# Patient Record
Sex: Male | Born: 1995 | Race: White | Hispanic: No | Marital: Married | Smoking: Never smoker
Health system: Southern US, Community
[De-identification: ages and names within clinical notes are randomized; demographics above are authoritative.]

## PROBLEM LIST (undated history)

## (undated) DIAGNOSIS — R740 Nonspecific elevation of levels of transaminase and lactic acid dehydrogenase [LDH]: Secondary | ICD-10-CM

## (undated) DIAGNOSIS — T7840XA Allergy, unspecified, initial encounter: Secondary | ICD-10-CM

## (undated) DIAGNOSIS — F32A Depression, unspecified: Secondary | ICD-10-CM

## (undated) DIAGNOSIS — E782 Mixed hyperlipidemia: Secondary | ICD-10-CM

## (undated) HISTORY — PX: MOUTH SURGERY: SHX715

## (undated) HISTORY — DX: Depression, unspecified: F32.A

## (undated) HISTORY — DX: Allergy, unspecified, initial encounter: T78.40XA

## (undated) HISTORY — DX: Nonspecific elevation of levels of transaminase and lactic acid dehydrogenase (ldh): R74.0

## (undated) HISTORY — DX: Mixed hyperlipidemia: E78.2

---

## 2015-01-20 DIAGNOSIS — J302 Other seasonal allergic rhinitis: Secondary | ICD-10-CM | POA: Insufficient documentation

## 2015-11-02 DIAGNOSIS — Z113 Encounter for screening for infections with a predominantly sexual mode of transmission: Secondary | ICD-10-CM | POA: Diagnosis not present

## 2015-11-12 DIAGNOSIS — R05 Cough: Secondary | ICD-10-CM | POA: Diagnosis not present

## 2015-11-12 DIAGNOSIS — J019 Acute sinusitis, unspecified: Secondary | ICD-10-CM | POA: Diagnosis not present

## 2016-01-02 DIAGNOSIS — J3089 Other allergic rhinitis: Secondary | ICD-10-CM | POA: Diagnosis not present

## 2016-10-05 DIAGNOSIS — B349 Viral infection, unspecified: Secondary | ICD-10-CM | POA: Diagnosis not present

## 2016-12-01 ENCOUNTER — Emergency Department (HOSPITAL_COMMUNITY)
Admission: EM | Admit: 2016-12-01 | Discharge: 2016-12-01 | Disposition: A | Discharge status: Home / Self Care | Payer: BLUE CROSS/BLUE SHIELD | Attending: Emergency Medicine | Admitting: Emergency Medicine

## 2016-12-01 ENCOUNTER — Encounter (HOSPITAL_COMMUNITY): Payer: Self-pay | Admitting: Emergency Medicine

## 2016-12-01 DIAGNOSIS — T17208A Unspecified foreign body in pharynx causing other injury, initial encounter: Secondary | ICD-10-CM | POA: Diagnosis not present

## 2016-12-01 DIAGNOSIS — Y939 Activity, unspecified: Secondary | ICD-10-CM | POA: Diagnosis not present

## 2016-12-01 DIAGNOSIS — X58XXXA Exposure to other specified factors, initial encounter: Secondary | ICD-10-CM | POA: Insufficient documentation

## 2016-12-01 DIAGNOSIS — Y999 Unspecified external cause status: Secondary | ICD-10-CM | POA: Insufficient documentation

## 2016-12-01 DIAGNOSIS — Y929 Unspecified place or not applicable: Secondary | ICD-10-CM | POA: Diagnosis not present

## 2016-12-01 NOTE — ED Notes (Signed)
PT DISCHARGED. INSTRUCTIONS GIVEN. AAOX4. PT IN NO APPARENT DISTRESS OR PAIN. THE OPPORTUNITY TO ASK QUESTIONS WAS PROVIDED. 

## 2016-12-01 NOTE — Discharge Instructions (Signed)
Return to the ED with any concerns including difficulty breathing or swallowing, or any other alarming symptoms °

## 2016-12-01 NOTE — ED Triage Notes (Signed)
Pt verbalizes felt something in throat "pulled it out and put it in this cup." Pt believes he may have a parasite.

## 2016-12-01 NOTE — ED Provider Notes (Signed)
[facility]-EMERGENCY DEPT Provider Note   CSN: [id number] Arrival date & time: 12/01/16  1122     History   Chief Complaint Chief Complaint  Patient presents with  . Foreign Body in Throat    HPI Jonathan Kaufman is a 21 y.o. male.  HPI  Pt presenting with c/o feeling a foreign body in his throat.  He had been walking outside and then noted he felt something sticking out of his tonsil into his mouth.  He pullled on it and pulled a long thin foreign body out of his mouth.  He is concerned this may be parasite.  No bleeding or pain from his tonsil.  No difficulty breathing or swallowing afterwards.  He has not had similar symptoms in the past.  There are no other associated systemic symptoms, there are no other alleviating or modifying factors.   History reviewed. No pertinent past medical history.  There are no active problems to display for this patient.   History reviewed. No pertinent surgical history.     Home Medications    Prior to Admission medications   Not on File    Family History No family history on file.  Social History Social History  Substance Use Topics  . Smoking status: Not on file  . Smokeless tobacco: Not on file  . Alcohol use Not on file     Allergies   Patient has no known allergies.   Review of Systems Review of Systems  ROS reviewed and all otherwise negative except for mentioned in HPI   Physical Exam Updated Vital Signs BP (!) 153/85 (BP Location: Right Arm)   Pulse 95   Temp 98.4 F (36.9 C) (Oral)   Resp 18   Ht 6\' 4"  (1.93 m)   Wt 260 lb (117.9 kg)   SpO2 100%   BMI 31.65 kg/m  Vitals reviewed Physical Exam Physical Examination: General appearance - alert, well appearing, and in no distress Mental status - alert, oriented to person, place, and time Eyes -  No conjunctival injection, no scleral icterus Mouth - mucous membranes moist, pharynx normal without lesions Neck - supple, no significant adenopathy Chest - clear to  auscultation, no wheezes, rales or rhonchi, symmetric air entry Heart - normal rate, regular rhythm, normal S1, S2, no murmurs, rubs, clicks or gallops Neurological - alert, oriented x 3, moving all extemiteis, normal speech Extremities - peripheral pulses normal, no pedal edema, no clubbing or cyanosis Skin - normal coloration and turgor, no rashes  ED Treatments / Results  Labs (all labs ordered are listed, but only abnormal results are displayed) Labs Reviewed - No data to display  EKG  EKG Interpretation None       Radiology No results found.  Procedures Procedures (including critical care time)  Medications Ordered in ED Medications - No data to display   Initial Impression / Assessment and Plan / ED Course  I have reviewed the triage vital signs and the nursing notes.  Pertinent labs & imaging results that were available during my care of the patient were reviewed by me and considered in my medical decision making (see chart for details).     Pt presenting with a foreign body that he pulled out of his throat.  - this appears under magnification to be a piece of plant matter- no parasite. He has normal OP exam, normal tonsil.  No difficulty swallowing or breathing.  Reassurance provided.  Discharged with strict return precautions.  Pt agreeable with plan.  Final Clinical Impressions(s) / ED Diagnoses   Final diagnoses:  Foreign body in pharynx, initial encounter    New Prescriptions There are no discharge medications for this patient.    Jerelyn Scott, MD 12/02/16 [phone]

## 2017-03-06 ENCOUNTER — Ambulatory Visit (INDEPENDENT_AMBULATORY_CARE_PROVIDER_SITE_OTHER): Payer: BLUE CROSS/BLUE SHIELD | Admitting: Family Medicine

## 2017-03-06 ENCOUNTER — Encounter: Payer: Self-pay | Admitting: Family Medicine

## 2017-03-06 VITALS — BP 120/70 | HR 76 | Ht 75.5 in | Wt 281.8 lb

## 2017-03-06 DIAGNOSIS — Z79899 Other long term (current) drug therapy: Secondary | ICD-10-CM | POA: Diagnosis not present

## 2017-03-06 DIAGNOSIS — Z7251 High risk heterosexual behavior: Secondary | ICD-10-CM | POA: Diagnosis not present

## 2017-03-06 DIAGNOSIS — Z7689 Persons encountering health services in other specified circumstances: Secondary | ICD-10-CM | POA: Diagnosis not present

## 2017-03-06 DIAGNOSIS — Z23 Encounter for immunization: Secondary | ICD-10-CM | POA: Diagnosis not present

## 2017-03-06 DIAGNOSIS — R635 Abnormal weight gain: Secondary | ICD-10-CM | POA: Diagnosis not present

## 2017-03-06 LAB — CBC WITH DIFFERENTIAL/PLATELET
BASOS PCT: 1 %
Basophils Absolute: 58 cells/uL (ref 0–200)
EOS PCT: 12 %
Eosinophils Absolute: 696 cells/uL — ABNORMAL HIGH (ref 15–500)
HCT: 48.8 % (ref 38.5–50.0)
HEMOGLOBIN: 16.7 g/dL (ref 13.2–17.1)
LYMPHS ABS: 2146 {cells}/uL (ref 850–3900)
Lymphocytes Relative: 37 %
MCH: 29.8 pg (ref 27.0–33.0)
MCHC: 34.2 g/dL (ref 32.0–36.0)
MCV: 87 fL (ref 80.0–100.0)
MONO ABS: 464 {cells}/uL (ref 200–950)
MPV: 10 fL (ref 7.5–12.5)
Monocytes Relative: 8 %
NEUTROS ABS: 2436 {cells}/uL (ref 1500–7800)
NEUTROS PCT: 42 %
Platelets: 270 10*3/uL (ref 140–400)
RBC: 5.61 MIL/uL (ref 4.20–5.80)
RDW: 13.2 % (ref 11.0–15.0)
WBC: 5.8 10*3/uL (ref 4.0–10.5)

## 2017-03-06 LAB — TSH: TSH: 1.76 mIU/L (ref 0.40–4.50)

## 2017-03-06 NOTE — Patient Instructions (Addendum)
Use the Lose It app as discussed. Start getting at least 150 minutes of exercise each week.  We will call you with lab results.   You will need to return in 1-2 months for your 2nd Gardasil vaccine and in 6 months (February) for your 3rd and final dose.

## 2017-03-06 NOTE — Progress Notes (Signed)
   Subjective:    Patient ID: Jonathan Kaufman, male    DOB: [birth year], 21 y.o.   MRN: [id number]  HPI Chief Complaint  Patient presents with  . new pt    new pt get established. no other concerns    He is new to the practice and here to establish care.   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 Works at [employer].   States he has been healthy. States he is concerned about weight gain over the past year. States he has gained approximately 40 lbs. Reports having issues with weight and has lost 80 lbs in the past by counting calories and eating healthy.     He would also like to discuss starting on PrEP. He has male sexual partners. Denies history of STDs.   He is not sure if he had the initial HPV vaccine in the past but he is certain that he did not have more than one. Would like to complete the series.   Denies fever, chills, dizziness, chest pain, palpitations, abdominal pain, N/V/D, urinary symptoms.   Reviewed allergies, medications, past medical, surgical, family, and social history.   Review of Systems Pertinent positives and negatives in the history of present illness.     Objective:   Physical Exam BP 120/70   Pulse 76   Ht 6' 3.5" (1.918 m)   Wt 281 lb 12.8 oz (127.8 kg)   BMI 34.76 kg/m  Alert and in no distress. Pharyngeal area is normal. Neck is supple without adenopathy or thyromegaly. Cardiac exam shows a regular sinus rhythm without murmurs or gallops. Lungs are clear to auscultation.     Assessment & Plan:  Weight gain - Plan: CBC with Differential/Platelet, Comprehensive metabolic panel, TSH  Encounter to establish care  High risk sexual behavior - Plan: RPR, GC/Chlamydia Probe Amp, HIV antibody, Hepatitis B surface antibody, Hepatitis B surface antigen  Medication management - Plan: Hepatitis B surface antibody, Hepatitis B surface antigen  Need for HPV vaccination  Counseled on healthy diet and exercise for weight loss. He will use a free app to track  calories and nutrition. Will check labs. Discussed that Truvada for PrEP is supposed to be in addition to condoms and safe sex and not in lieu of these.  Pending labs results we will start him on Truvada. Handouts given regarding PrEP.  He is aware that he will need the 3 injection series of Gardasil. 1st dose given.  He will need the 2nd dose in 1-2 months and 3rd dose in 6 months.

## 2017-03-07 ENCOUNTER — Other Ambulatory Visit: Payer: Self-pay | Admitting: Family Medicine

## 2017-03-07 ENCOUNTER — Encounter: Payer: Self-pay | Admitting: Family Medicine

## 2017-03-07 DIAGNOSIS — Z789 Other specified health status: Secondary | ICD-10-CM | POA: Insufficient documentation

## 2017-03-07 DIAGNOSIS — Z7251 High risk heterosexual behavior: Secondary | ICD-10-CM | POA: Insufficient documentation

## 2017-03-07 DIAGNOSIS — Z79899 Other long term (current) drug therapy: Secondary | ICD-10-CM

## 2017-03-07 LAB — COMPREHENSIVE METABOLIC PANEL
ALBUMIN: 4.7 g/dL (ref 3.6–5.1)
ALT: 28 U/L (ref 9–46)
AST: 21 U/L (ref 10–40)
Alkaline Phosphatase: 63 U/L (ref 40–115)
BUN: 9 mg/dL (ref 7–25)
CALCIUM: 9.6 mg/dL (ref 8.6–10.3)
CHLORIDE: 101 mmol/L (ref 98–110)
CO2: 21 mmol/L (ref 20–32)
CREATININE: 0.83 mg/dL (ref 0.60–1.35)
Glucose, Bld: 84 mg/dL (ref 65–99)
Potassium: 4.3 mmol/L (ref 3.5–5.3)
SODIUM: 139 mmol/L (ref 135–146)
Total Bilirubin: 0.6 mg/dL (ref 0.2–1.2)
Total Protein: 7.3 g/dL (ref 6.1–8.1)

## 2017-03-07 LAB — RPR

## 2017-03-07 LAB — HIV ANTIBODY (ROUTINE TESTING W REFLEX): HIV: NONREACTIVE

## 2017-03-07 LAB — HEPATITIS B SURFACE ANTIBODY,QUALITATIVE: HEP B S AB: NONREACTIVE

## 2017-03-07 LAB — GC/CHLAMYDIA PROBE AMP
CT Probe RNA: NOT DETECTED
GC Probe RNA: NOT DETECTED

## 2017-03-07 LAB — HEPATITIS B SURFACE ANTIGEN: HEP B S AG: NONREACTIVE

## 2017-03-07 MED ORDER — EMTRICITABINE-TENOFOVIR DF 200-300 MG PO TABS: 1.0000 | ORAL_TABLET | Freq: Every day | ORAL | 2 refills | Status: DC

## 2017-03-07 NOTE — Progress Notes (Signed)
   Subjective:    Patient ID: Jonathan LaxDylan Hall, male    DOB: [birth year], 21 y.o.   MRN: [id number]  HPI    Review of Systems     Objective:   Physical Exam        Assessment & Plan:

## 2017-03-15 ENCOUNTER — Encounter: Payer: Self-pay | Admitting: Family Medicine

## 2017-03-17 ENCOUNTER — Telehealth: Payer: Self-pay | Admitting: Family Medicine

## 2017-03-17 NOTE — Telephone Encounter (Signed)
P.A. TRUVADA 

## 2017-03-18 NOTE — Telephone Encounter (Signed)
Called pharmacy because P.A. Showing Medicare ID# [id number] K, [facility] states pt picked up already ran with discount card and pt got for free.  They will follow up with pt as to correct insurance.

## 2017-03-20 ENCOUNTER — Other Ambulatory Visit: Payer: Self-pay

## 2017-03-22 ENCOUNTER — Other Ambulatory Visit (INDEPENDENT_AMBULATORY_CARE_PROVIDER_SITE_OTHER): Payer: BLUE CROSS/BLUE SHIELD

## 2017-03-22 DIAGNOSIS — Z23 Encounter for immunization: Secondary | ICD-10-CM

## 2017-05-07 ENCOUNTER — Encounter: Payer: Self-pay | Admitting: Family Medicine

## 2017-05-07 ENCOUNTER — Other Ambulatory Visit: Payer: Self-pay | Admitting: Internal Medicine

## 2017-06-18 ENCOUNTER — Encounter: Payer: Self-pay | Admitting: Family Medicine

## 2017-07-04 ENCOUNTER — Encounter: Payer: Self-pay | Admitting: Family Medicine

## 2017-12-24 ENCOUNTER — Encounter: Payer: Self-pay | Admitting: Family Medicine

## 2017-12-24 ENCOUNTER — Ambulatory Visit: Payer: BLUE CROSS/BLUE SHIELD | Admitting: Family Medicine

## 2017-12-24 VITALS — BP 110/70 | HR 86 | Temp 98.5°F | Resp 16 | Ht 75.0 in | Wt 291.2 lb

## 2017-12-24 DIAGNOSIS — J029 Acute pharyngitis, unspecified: Secondary | ICD-10-CM | POA: Diagnosis not present

## 2017-12-24 LAB — POCT RAPID STREP A (OFFICE): RAPID STREP A SCREEN: NEGATIVE

## 2017-12-24 NOTE — Patient Instructions (Signed)
Your strep test is negative.  I suspect your symptoms are related to a viral illness and recommend treating your symptoms at this point.  Mucinex DM for congestion and cough, drink extra water, use salt water gargles for throat irritation and Tylenol or Ibuprofen for aches and pains.  Call if you are not improving by days 7-10 of your illness or if you develop fever, wheezing or worsening symptoms.   

## 2017-12-24 NOTE — Progress Notes (Signed)
Chief Complaint  Patient presents with  . sore throat    sore throat. started yesterday. hurts to swallow    Subjective:  Jonathan LaxDylan Kaufman is a 22 y.o. male who presents for evaluation of sore throat.  He has not had a recent close exposure to someone with proven streptococcal pharyngitis.  Associated symptoms include nasal congestion, ears feeling full and cough productive of yellow phlegm that started today.  Hx of recurrent strep in his teens.   No smoking. No recent antibiotic.   Treatment to date: Dayquil.  ? sick contacts.  No other aggravating or relieving factors.  No other c/o.  The following portions of the patient's history were reviewed and updated as appropriate: allergies, current medications, past medical history, past social history, past surgical history and problem list.  ROS as in subjective   Objective: Vitals:   12/24/17 1048  BP: 110/70  Pulse: 86  Resp: 16  Temp: 98.5 F (36.9 C)  SpO2: 92%    General appearance: no distress, WD/WN, well -appearing HEENT: normocephalic, conjunctiva/corneas normal, sclerae anicteric, nares patent, no discharge or erythema, pharynx with erythema, without exudate.  Oral cavity: MMM, no lesions  Neck: supple, no lymphadenopathy, no thyromegaly Heart: RRR, normal S1, S2, no murmurs Lungs: CTA bilaterally, no wheezes, rhonchi, or rales    Laboratory Strep test done. Results:negative.    Assessment and Plan: Acute pharyngitis, unspecified etiology - Plan: POCT rapid strep A   Advised that symptoms and exam suggest a viral etiology.  Discussed symptomatic treatment including salt water gargles, warm fluids, rest, hydrate well, can use over-the-counter Tylenol or ibuprofen for throat pain, fever, or malaise. If worse or not improving within 2-3 days, call or return.

## 2018-05-26 ENCOUNTER — Encounter: Payer: BLUE CROSS/BLUE SHIELD | Admitting: Family Medicine

## 2018-05-30 ENCOUNTER — Encounter: Payer: Self-pay | Admitting: Family Medicine

## 2018-05-30 ENCOUNTER — Ambulatory Visit (INDEPENDENT_AMBULATORY_CARE_PROVIDER_SITE_OTHER): Payer: BLUE CROSS/BLUE SHIELD | Admitting: Family Medicine

## 2018-05-30 VITALS — BP 122/80 | HR 89 | Ht 76.0 in | Wt 302.6 lb

## 2018-05-30 DIAGNOSIS — E669 Obesity, unspecified: Secondary | ICD-10-CM | POA: Diagnosis not present

## 2018-05-30 DIAGNOSIS — Z7185 Encounter for immunization safety counseling: Secondary | ICD-10-CM

## 2018-05-30 DIAGNOSIS — Z23 Encounter for immunization: Secondary | ICD-10-CM | POA: Diagnosis not present

## 2018-05-30 DIAGNOSIS — Z7189 Other specified counseling: Secondary | ICD-10-CM | POA: Diagnosis not present

## 2018-05-30 DIAGNOSIS — Z789 Other specified health status: Secondary | ICD-10-CM

## 2018-05-30 DIAGNOSIS — Z13220 Encounter for screening for lipoid disorders: Secondary | ICD-10-CM

## 2018-05-30 DIAGNOSIS — Z0000 Encounter for general adult medical examination without abnormal findings: Secondary | ICD-10-CM

## 2018-05-30 DIAGNOSIS — Z8349 Family history of other endocrine, nutritional and metabolic diseases: Secondary | ICD-10-CM | POA: Insufficient documentation

## 2018-05-30 LAB — POCT URINALYSIS DIP (PROADVANTAGE DEVICE)
BILIRUBIN UA: NEGATIVE mg/dL
Bilirubin, UA: NEGATIVE
Blood, UA: NEGATIVE
Glucose, UA: NEGATIVE mg/dL
LEUKOCYTES UA: NEGATIVE
Nitrite, UA: NEGATIVE
PH UA: 7 (ref 5.0–8.0)
Protein Ur, POC: NEGATIVE mg/dL
SPECIFIC GRAVITY, URINE: 1.015
Urobilinogen, Ur: NEGATIVE

## 2018-05-30 NOTE — Patient Instructions (Signed)
Preventative Care for Adults, Male       REGULAR HEALTH EXAMS:  A routine yearly physical is a good way to check in with your primary care provider about your health and preventive screening. It is also an opportunity to share updates about your health and any concerns you have, and receive a thorough all-over exam.   Most health insurance companies pay for at least some preventative services.  Check with your health plan for specific coverages.  WHAT PREVENTATIVE SERVICES DO MEN NEED?  Adult men should have their weight and blood pressure checked regularly.   Men age 35 and older should have their cholesterol levels checked regularly.  Beginning at age 50 and continuing to age 75, men should be screened for colorectal cancer.  Certain people should may need continued testing until age 85.  Other cancer screening may include exams for testicular and prostate cancer.  Updating vaccinations is part of preventative care.  Vaccinations help protect against diseases such as the flu.  Lab tests are generally done as part of preventative care to screen for anemia and blood disorders, to screen for problems with the kidneys and liver, to screen for bladder problems, to check blood sugar, and to check your cholesterol level.  Preventative services generally include counseling about diet, exercise, avoiding tobacco, drugs, excessive alcohol consumption, and sexually transmitted infections.    GENERAL RECOMMENDATIONS FOR GOOD HEALTH:  Healthy diet:  Eat a variety of foods, including fruit, vegetables, animal or vegetable protein, such as meat, fish, chicken, and eggs, or beans, lentils, tofu, and grains, such as rice.  Drink plenty of water daily.  Decrease saturated fat in the diet, avoid lots of red meat, processed foods, sweets, fast foods, and fried foods.  Exercise:  Aerobic exercise helps maintain good heart health. At least 30-40 minutes of moderate-intensity exercise is recommended.  For example, a brisk walk that increases your heart rate and breathing. This should be done on most days of the week.   Find a type of exercise or a variety of exercises that you enjoy so that it becomes a part of your daily life.  Examples are running, walking, swimming, water aerobics, and biking.  For motivation and support, explore group exercise such as aerobic class, spin class, Zumba, Yoga,or  martial arts, etc.    Set exercise goals for yourself, such as a certain weight goal, walk or run in a race such as a 5k walk/run.  Speak to your primary care provider about exercise goals.  Disease prevention:  If you smoke or chew tobacco, find out from your caregiver how to quit. It can literally save your life, no matter how long you have been a tobacco user. If you do not use tobacco, never begin.   Maintain a healthy diet and normal weight. Increased weight leads to problems with blood pressure and diabetes.   The Body Mass Index or BMI is a way of measuring how much of your body is fat. Having a BMI above 27 increases the risk of heart disease, diabetes, hypertension, stroke and other problems related to obesity. Your caregiver can help determine your BMI and based on it develop an exercise and dietary program to help you achieve or maintain this important measurement at a healthful level.  High blood pressure causes heart and blood vessel problems.  Persistent high blood pressure should be treated with medicine if weight loss and exercise do not work.   Fat and cholesterol leaves deposits in your arteries   that can block them. This causes heart disease and vessel disease elsewhere in your body.  If your cholesterol is found to be high, or if you have heart disease or certain other medical conditions, then you may need to have your cholesterol monitored frequently and be treated with medication.   Ask if you should have a stress test if your history suggests this. A stress test is a test done on  a treadmill that looks for heart disease. This test can find disease prior to there being a problem.  Avoid drinking alcohol in excess (more than two drinks per day).  Avoid use of street drugs. Do not share needles with anyone. Ask for professional help if you need assistance or instructions on stopping the use of alcohol, cigarettes, and/or drugs.  Brush your teeth twice a day with fluoride toothpaste, and floss once a day. Good oral hygiene prevents tooth decay and gum disease. The problems can be painful, unattractive, and can cause other health problems. Visit your dentist for a routine oral and dental check up and preventive care every 6-12 months.   Look at your skin regularly.  Use a mirror to look at your back. Notify your caregivers of changes in moles, especially if there are changes in shapes, colors, a size larger than a pencil eraser, an irregular border, or development of new moles.  Safety:  Use seatbelts 100% of the time, whether driving or as a passenger.  Use safety devices such as hearing protection if you work in environments with loud noise or significant background noise.  Use safety glasses when doing any work that could send debris in to the eyes.  Use a helmet if you ride a bike or motorcycle.  Use appropriate safety gear for contact sports.  Talk to your caregiver about gun safety.  Use sunscreen with a SPF (or skin protection factor) of 15 or greater.  Lighter skinned people are at a greater risk of skin cancer. Don't forget to also wear sunglasses in order to protect your eyes from too much damaging sunlight. Damaging sunlight can accelerate cataract formation.   Practice safe sex. Use condoms. Condoms are used for birth control and to help reduce the spread of sexually transmitted infections (or STIs).  Some of the STIs are gonorrhea (the clap), chlamydia, syphilis, trichomonas, herpes, HPV (human papilloma virus) and HIV (human immunodeficiency virus) which causes AIDS.  The herpes, HIV and HPV are viral illnesses that have no cure. These can result in disability, cancer and death.   Keep carbon monoxide and smoke detectors in your home functioning at all times. Change the batteries every 6 months or use a model that plugs into the wall.   Vaccinations:  Stay up to date with your tetanus shots and other required immunizations. You should have a booster for tetanus every 10 years. Be sure to get your flu shot every year, since 5%-20% of the [place] population comes down with the flu. The flu vaccine changes each year, so being vaccinated once is not enough. Get your shot in the fall, before the flu season peaks.   Other vaccines to consider:  Pneumococcal vaccine to protect against certain types of pneumonia.  This is normally recommended for adults age 65 or older.  However, adults younger than 22 years old with certain underlying conditions such as diabetes, heart or lung disease should also receive the vaccine.  Shingles vaccine to protect against Varicella Zoster if you are older than age 60, or younger   than 22 years old with certain underlying illness.  Hepatitis A vaccine to protect against a form of infection of the liver by a virus acquired from food.  Hepatitis B vaccine to protect against a form of infection of the liver by a virus acquired from blood or body fluids, particularly if you work in health care.  If you plan to travel internationally, check with your local health department for specific vaccination recommendations.  Cancer Screening:  Most routine colon cancer screening begins at the age of 50. On a yearly basis, doctors may provide special easy to use take-home tests to check for hidden blood in the stool. Sigmoidoscopy or colonoscopy can detect the earliest forms of colon cancer and is life saving. These tests use a small camera at the end of a tube to directly examine the colon. Speak to your caregiver about this at age 50, when routine  screening begins (and is repeated every 5 years unless early forms of pre-cancerous polyps or small growths are found).   At the age of 50 men usually start screening for prostate cancer every year. Screening may begin at a younger age for those with higher risk. Those at higher risk include African-Americans or having a family history of prostate cancer. There are two types of tests for prostate cancer:   Prostate-specific antigen (PSA) testing. Recent studies raise questions about prostate cancer using PSA and you should discuss this with your caregiver.   Digital rectal exam (in which your doctor's lubricated and gloved finger feels for enlargement of the prostate through the anus).   Screening for testicular cancer.  Do a monthly exam of your testicles. Gently roll each testicle between your thumb and fingers, feeling for any abnormal lumps. The best time to do this is after a hot shower or bath when the tissues are looser. Notify your caregivers of any lumps, tenderness or changes in size or shape immediately.     

## 2018-05-30 NOTE — Progress Notes (Signed)
Subjective:    Patient ID: Jonathan Kaufman, male    DOB: 14-Oct-1995, 22 y.o.   MRN: [id number]  HPI Chief Complaint  Patient presents with  . physical    physical, flu shot and tdap   He is here for a complete physical exam. No concerns or complaints today.   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  Denies smoking, drinking alcohol, drug use Diet: fairly healthy  Exercise: nothing regular   Immunizations: Tdap and flu shots given today   Health maintenance:  Colonoscopy: N/A Last PSA: N/A Last Dental Exam: UTD Last Eye Exam: UTD  Wears seatbelt always, uses sunscreen, smoke detectors in home and functioning, does not text while driving, feels safe in home environment.  Reviewed allergies, medications, past medical, surgical, family, and social history.    Review of Systems Review of Systems Constitutional: -fever, -chills, -sweats, -unexpected weight change,-fatigue ENT: -runny nose, -ear pain, -sore throat Cardiology:  -chest pain, -palpitations, -edema Respiratory: -cough, -shortness of breath, -wheezing Gastroenterology: -abdominal pain, -nausea, -vomiting, -diarrhea, -constipation  Hematology: -bleeding or bruising problems Musculoskeletal: -arthralgias, -myalgias, -joint swelling, -back pain Ophthalmology: -vision changes Urology: -dysuria, -difficulty urinating, -hematuria, -urinary frequency, -urgency Neurology: -headache, -weakness, -tingling, -numbness        Objective:   Physical Exam BP 122/80   Pulse 89   Ht 6\' 4"  (1.93 m)   Wt (!) 302 lb 9.6 oz (137.3 kg)   HC 50.5" (128.3 cm) Comment: waist CIRCUMFERENCE  BMI 36.83 kg/m   General Appearance:    Alert, cooperative, no distress, appears stated age  Head:    Normocephalic, without obvious abnormality, atraumatic  Eyes:    PERRL, conjunctiva/corneas clear, EOM's intact, fundi    benign  Ears:     Normal TM's and external ear canals  Nose:   Nares normal, mucosa normal, no drainage or sinus   tenderness  Throat:   Lips, mucosa, and tongue normal; teeth and gums normal  Neck:   Supple, no lymphadenopathy;  thyroid:  no   enlargement/tenderness/nodules; no carotid   bruit or JVD  Back:    Spine nontender, no curvature, ROM normal, no CVA     tenderness  Lungs:     Clear to auscultation bilaterally without wheezes, rales or     ronchi; respirations unlabored  Chest Wall:    No tenderness or deformity   Heart:    Regular rate and rhythm, S1 and S2 normal, no murmur, rub   or gallop  Breast Exam:    No chest wall tenderness, masses or gynecomastia  Abdomen:     Soft, non-tender, nondistended, normoactive bowel sounds,    no masses, no hepatosplenomegaly  Genitalia:    Declines.   Rectal:   Deferred due to age <40 and lack of symptoms  Extremities:   No clubbing, cyanosis or edema  Pulses:   2+ and symmetric all extremities  Skin:   Skin color, texture, turgor normal, no rashes or lesions  Lymph nodes:   Cervical, supraclavicular, and axillary nodes normal  Neurologic:   CNII-XII intact, normal strength, sensation and gait; reflexes 2+ and symmetric throughout          Psych:   Normal mood, affect, hygiene and grooming.     Urinalysis dipstick: negative       Assessment & Plan:  Routine general medical examination at a health care facility - Plan: POCT  Urinalysis DIP (Proadvantage Device), CBC with Differential/Platelet, Comprehensive metabolic panel, TSH, Lipid panel, T4, free  Obesity (BMI 30-39.9) - Plan: TSH, Lipid panel, T4, free  Not immune to hepatitis B virus - Plan: Hepatitis B vaccine adult IM  Immunization counseling - Plan: Flu Vaccine QUAD 36+ mos IM, Tdap vaccine greater than or equal to 7yo IM  Needs flu shot - Plan: Flu Vaccine QUAD 36+ mos IM  Need for diphtheria-tetanus-pertussis (Tdap) vaccine - Plan: Tdap vaccine greater than or equal to 7yo IM  Screening  for lipid disorders - Plan: Lipid panel  Family history of thyroid disease - Plan: TSH, T4, free  He appears to be doing well overall. Counseling done on healthy diet and exercise for weight loss.  Discussed potential long-term health consequences of obesity. He has a form that needs to be filled out for his job that requires a lipid panel. Discussed safety and health promotion. Immunizations discussed and flu shot as well as Tdap was given.  Counseling done on all components of these vaccines. Discussed that last year he was not immune to hepatitis B did receive 1 hep B vaccine.  He will get the second hep B today. He declined GU exam.  I discussed the importance of self testicular exams in his age group. Follow-up pending labs

## 2018-05-31 LAB — CBC WITH DIFFERENTIAL/PLATELET
BASOS: 1 %
Basophils Absolute: 0 10*3/uL (ref 0.0–0.2)
EOS (ABSOLUTE): 0.6 10*3/uL — AB (ref 0.0–0.4)
Eos: 8 %
HEMOGLOBIN: 16.1 g/dL (ref 13.0–17.7)
Hematocrit: 47.5 % (ref 37.5–51.0)
IMMATURE GRANS (ABS): 0 10*3/uL (ref 0.0–0.1)
Immature Granulocytes: 0 %
LYMPHS: 43 %
Lymphocytes Absolute: 3.1 10*3/uL (ref 0.7–3.1)
MCH: 29.2 pg (ref 26.6–33.0)
MCHC: 33.9 g/dL (ref 31.5–35.7)
MCV: 86 fL (ref 79–97)
MONOCYTES: 10 %
Monocytes Absolute: 0.7 10*3/uL (ref 0.1–0.9)
NEUTROS ABS: 2.7 10*3/uL (ref 1.4–7.0)
Neutrophils: 38 %
Platelets: 294 10*3/uL (ref 150–450)
RBC: 5.51 x10E6/uL (ref 4.14–5.80)
RDW: 12.5 % (ref 12.3–15.4)
WBC: 7.2 10*3/uL (ref 3.4–10.8)

## 2018-05-31 LAB — LIPID PANEL
CHOL/HDL RATIO: 6.9 ratio — AB (ref 0.0–5.0)
CHOLESTEROL TOTAL: 234 mg/dL — AB (ref 100–199)
HDL: 34 mg/dL — ABNORMAL LOW (ref >39)
LDL CALC: 145 mg/dL — AB (ref 0–99)
TRIGLYCERIDES: 276 mg/dL — AB (ref 0–149)
VLDL Cholesterol Cal: 55 mg/dL — ABNORMAL HIGH (ref 5–40)

## 2018-05-31 LAB — COMPREHENSIVE METABOLIC PANEL
A/G RATIO: 1.9 (ref 1.2–2.2)
ALT: 40 IU/L (ref 0–44)
AST: 24 IU/L (ref 0–40)
Albumin: 4.7 g/dL (ref 3.5–5.5)
Alkaline Phosphatase: 62 IU/L (ref 39–117)
BILIRUBIN TOTAL: 0.4 mg/dL (ref 0.0–1.2)
BUN/Creatinine Ratio: 12 (ref 9–20)
BUN: 10 mg/dL (ref 6–20)
CHLORIDE: 102 mmol/L (ref 96–106)
CO2: 20 mmol/L (ref 20–29)
Calcium: 9.4 mg/dL (ref 8.7–10.2)
Creatinine, Ser: 0.84 mg/dL (ref 0.76–1.27)
GFR calc non Af Amer: 124 mL/min/{1.73_m2} (ref >59)
GFR, EST AFRICAN AMERICAN: 144 mL/min/{1.73_m2} (ref >59)
Globulin, Total: 2.5 g/dL (ref 1.5–4.5)
Glucose: 91 mg/dL (ref 65–99)
POTASSIUM: 3.9 mmol/L (ref 3.5–5.2)
SODIUM: 142 mmol/L (ref 134–144)
TOTAL PROTEIN: 7.2 g/dL (ref 6.0–8.5)

## 2018-05-31 LAB — T4, FREE: FREE T4: 1.34 ng/dL (ref 0.82–1.77)

## 2018-05-31 LAB — TSH: TSH: 2.25 u[IU]/mL (ref 0.450–4.500)

## 2018-07-30 ENCOUNTER — Other Ambulatory Visit (INDEPENDENT_AMBULATORY_CARE_PROVIDER_SITE_OTHER): Payer: BLUE CROSS/BLUE SHIELD

## 2018-07-30 DIAGNOSIS — E785 Hyperlipidemia, unspecified: Secondary | ICD-10-CM

## 2018-07-30 DIAGNOSIS — Z23 Encounter for immunization: Secondary | ICD-10-CM | POA: Diagnosis not present

## 2018-07-30 LAB — LIPID PANEL
CHOL/HDL RATIO: 6.2 ratio — AB (ref 0.0–5.0)
Cholesterol, Total: 235 mg/dL — ABNORMAL HIGH (ref 100–199)
HDL: 38 mg/dL — AB (ref >39)
LDL Calculated: 161 mg/dL — ABNORMAL HIGH (ref 0–99)
Triglycerides: 179 mg/dL — ABNORMAL HIGH (ref 0–149)
VLDL Cholesterol Cal: 36 mg/dL (ref 5–40)

## 2018-11-08 ENCOUNTER — Encounter: Payer: Self-pay | Admitting: Family Medicine

## 2018-11-23 ENCOUNTER — Encounter: Payer: Self-pay | Admitting: Family Medicine

## 2018-11-26 ENCOUNTER — Encounter: Payer: Self-pay | Admitting: Family Medicine

## 2018-11-26 ENCOUNTER — Ambulatory Visit (INDEPENDENT_AMBULATORY_CARE_PROVIDER_SITE_OTHER): Payer: BLUE CROSS/BLUE SHIELD | Admitting: Family Medicine

## 2018-11-26 ENCOUNTER — Other Ambulatory Visit: Payer: Self-pay

## 2018-11-26 VITALS — Temp 97.5°F | Wt 302.0 lb

## 2018-11-26 DIAGNOSIS — F339 Major depressive disorder, recurrent, unspecified: Secondary | ICD-10-CM | POA: Diagnosis not present

## 2018-11-26 DIAGNOSIS — E782 Mixed hyperlipidemia: Secondary | ICD-10-CM | POA: Insufficient documentation

## 2018-11-26 DIAGNOSIS — F418 Other specified anxiety disorders: Secondary | ICD-10-CM | POA: Insufficient documentation

## 2018-11-26 DIAGNOSIS — Z8349 Family history of other endocrine, nutritional and metabolic diseases: Secondary | ICD-10-CM

## 2018-11-26 HISTORY — DX: Mixed hyperlipidemia: E78.2

## 2018-11-26 NOTE — Progress Notes (Signed)
Subjective:   Documentation for virtual audio and video telecommunications through [employer] encounter:  The patient was located at home. 2 patient identifiers.  The provider was located in the office. The patient did consent to this visit and is aware of possible charges through their insurance for this visit.  The other persons participating in this telemedicine service were none.    Patient ID: Jonathan Kaufman, male    DOB: 04/05/1996, 23 y.o.   MRN: [id number]  HPI Chief Complaint  Patient presents with   depression    discuss depression   Complains of worsening depression. Reports having a history of depression since approximately age 89.  Denies ever being on medication for this.  States he has been also having some anxiety over the past few weeks. Has a therapist he is talking to every other week. Next appointment is next week.   Lost his job due to the current coronavirus pandemic. States he has a job interview today and is feeling anxious.  His wedding was canceled and he is sad about this.   He questions whether he may have low testosterone and would like to have this tested.  Desire having any issues with getting an erection, having an orgasm. Denies low libido, fatigue or decreased endurance.   Denies fever, chills, dizziness, chest pain, palpitations, shortness of breath, abdominal pain, N/V/D, urinary symptoms.    Denies alcohol or drug use  Taking Melatonin 1.5 mg for sleep     [facility] 11/26/2018 05/30/2018 03/06/2017  Decreased Interest 2 0 0  Down, Depressed, Hopeless 2 0 0  PHQ - 2 Score 4 0 0  Altered sleeping 3 - -  Tired, decreased energy 1 - -  Change in appetite 1 - -  Feeling bad or failure about yourself  3 - -  Trouble concentrating 0 - -  Moving slowly or fidgety/restless 3 - -  Suicidal thoughts 0 - -  PHQ-9 Score 15 - -  Difficult doing work/chores Somewhat difficult - -   Reviewed allergies, medications, past medical,  surgical, family, and social history.    Review of Systems Pertinent positives and negatives in the history of present illness.     Objective:   Physical Exam Temp (!) 97.5 F (36.4 C) (Oral)    Wt (!) 302 lb (137 kg)    BMI 36.76 kg/m   Alert and oriented and in no acute distress.  Normal mood, thought process.  Denies SI or HI.      Assessment & Plan:  Depression, recurrent (HCC) - Plan: CBC with Differential/Platelet, Comprehensive metabolic panel, TSH, T4, free, Testosterone, CANCELED: CBC with Differential/Platelet, CANCELED: Comprehensive metabolic panel, CANCELED: TSH, CANCELED: T4, free, CANCELED: Testosterone  Situational anxiety  Family history of thyroid disease - Plan: TSH, T4, free, CANCELED: TSH, CANCELED: T4, free  Discussed limitations of virtual visit. Apparently he has a long history of depression and is seeing his therapist every other week.  He denies any thoughts of self-harm.  Does not appear to be in any danger. He denies ever taking medication for anxiety or depression.  He is not interested in this at this time. Reassured him that most people are feeling increased anxiety due to the current coronavirus pandemic and that he is not alone.  Encouraged him to give himself permission to have some situational anxiety and to feel depressed since he has had significant life changes including postponing his wedding, losing his job, etc.  He does have  a job interview later today and I wished him good luck.  He questions whether he could have low testosterone that could be contributing to his depression.  Discussed that he does not have other typical hypergonadism symptoms.  We will check his testosterone level per his request.  He will come in for labs and we will follow-up. Consider medication.    Time spent on call was 17 minutes and in review of previous records 3 minutes total.  This virtual service is not related to other E/M service within previous 7 days.

## 2018-11-27 ENCOUNTER — Other Ambulatory Visit: Payer: BLUE CROSS/BLUE SHIELD

## 2018-11-27 DIAGNOSIS — Z8349 Family history of other endocrine, nutritional and metabolic diseases: Secondary | ICD-10-CM | POA: Diagnosis not present

## 2018-11-27 DIAGNOSIS — F339 Major depressive disorder, recurrent, unspecified: Secondary | ICD-10-CM

## 2018-11-27 DIAGNOSIS — R740 Nonspecific elevation of levels of transaminase and lactic acid dehydrogenase [LDH]: Secondary | ICD-10-CM | POA: Diagnosis not present

## 2018-11-28 ENCOUNTER — Encounter: Payer: Self-pay | Admitting: Family Medicine

## 2018-11-28 DIAGNOSIS — R7401 Elevation of levels of liver transaminase levels: Secondary | ICD-10-CM

## 2018-11-28 DIAGNOSIS — R740 Nonspecific elevation of levels of transaminase and lactic acid dehydrogenase [LDH]: Secondary | ICD-10-CM

## 2018-11-28 HISTORY — DX: Elevation of levels of liver transaminase levels: R74.01

## 2018-11-28 LAB — CBC WITH DIFFERENTIAL/PLATELET
Basophils Absolute: 0 10*3/uL (ref 0.0–0.2)
Basos: 1 %
EOS (ABSOLUTE): 0.7 10*3/uL — ABNORMAL HIGH (ref 0.0–0.4)
Eos: 11 %
Hematocrit: 49 % (ref 37.5–51.0)
Hemoglobin: 16.6 g/dL (ref 13.0–17.7)
Immature Grans (Abs): 0 10*3/uL (ref 0.0–0.1)
Immature Granulocytes: 0 %
Lymphocytes Absolute: 2.2 10*3/uL (ref 0.7–3.1)
Lymphs: 37 %
MCH: 28.7 pg (ref 26.6–33.0)
MCHC: 33.9 g/dL (ref 31.5–35.7)
MCV: 85 fL (ref 79–97)
Monocytes Absolute: 0.6 10*3/uL (ref 0.1–0.9)
Monocytes: 9 %
Neutrophils Absolute: 2.5 10*3/uL (ref 1.4–7.0)
Neutrophils: 42 %
Platelets: 297 10*3/uL (ref 150–450)
RBC: 5.78 x10E6/uL (ref 4.14–5.80)
RDW: 12.8 % (ref 11.6–15.4)
WBC: 5.9 10*3/uL (ref 3.4–10.8)

## 2018-11-28 LAB — TSH: TSH: 2.72 u[IU]/mL (ref 0.450–4.500)

## 2018-11-28 LAB — COMPREHENSIVE METABOLIC PANEL
ALT: 53 IU/L — ABNORMAL HIGH (ref 0–44)
AST: 30 IU/L (ref 0–40)
Albumin/Globulin Ratio: 1.6 (ref 1.2–2.2)
Albumin: 4.4 g/dL (ref 4.1–5.2)
Alkaline Phosphatase: 63 IU/L (ref 39–117)
BUN/Creatinine Ratio: 9 (ref 9–20)
BUN: 9 mg/dL (ref 6–20)
Bilirubin Total: 0.4 mg/dL (ref 0.0–1.2)
CO2: 25 mmol/L (ref 20–29)
Calcium: 9.5 mg/dL (ref 8.7–10.2)
Chloride: 102 mmol/L (ref 96–106)
Creatinine, Ser: 0.97 mg/dL (ref 0.76–1.27)
GFR calc Af Amer: 127 mL/min/{1.73_m2} (ref >59)
GFR calc non Af Amer: 110 mL/min/{1.73_m2} (ref >59)
Globulin, Total: 2.7 g/dL (ref 1.5–4.5)
Glucose: 92 mg/dL (ref 65–99)
Potassium: 4.5 mmol/L (ref 3.5–5.2)
Sodium: 139 mmol/L (ref 134–144)
Total Protein: 7.1 g/dL (ref 6.0–8.5)

## 2018-11-28 LAB — T4, FREE: Free T4: 1.23 ng/dL (ref 0.82–1.77)

## 2018-11-28 LAB — TESTOSTERONE: Testosterone: 309 ng/dL (ref 264–916)

## 2018-11-30 LAB — HEPATITIS PANEL, ACUTE
Hep A IgM: NEGATIVE
Hep B C IgM: NEGATIVE
Hep C Virus Ab: <0.1 s/co ratio (ref 0.0–0.9)
Hepatitis B Surface Ag: NEGATIVE

## 2018-11-30 LAB — SPECIMEN STATUS REPORT

## 2019-02-02 DIAGNOSIS — Z03818 Encounter for observation for suspected exposure to other biological agents ruled out: Secondary | ICD-10-CM | POA: Diagnosis not present

## 2019-02-03 ENCOUNTER — Telehealth: Payer: BLUE CROSS/BLUE SHIELD | Admitting: Family

## 2019-02-03 DIAGNOSIS — M5440 Lumbago with sciatica, unspecified side: Secondary | ICD-10-CM

## 2019-02-03 MED ORDER — NAPROXEN 500 MG PO TABS: 500.0000 mg | ORAL_TABLET | Freq: Two times a day (BID) | ORAL | 0 refills | Status: DC

## 2019-02-03 MED ORDER — CYCLOBENZAPRINE HCL 10 MG PO TABS: 10.0000 mg | ORAL_TABLET | Freq: Three times a day (TID) | ORAL | 0 refills | Status: DC | PRN

## 2019-02-03 NOTE — Progress Notes (Signed)
We are sorry that you are not feeling well.  Here is how we plan to help!  Based on what you have shared with me it looks like you mostly have acute back pain.  Acute back pain is defined as musculoskeletal pain that can resolve in 1-3 weeks with conservative treatment.  I have prescribed Naprosyn 500 mg twice a day non-steroid anti-inflammatory (NSAID) as well as Flexeril 10 mg every eight hours as needed which is a muscle relaxer  Some patients experience stomach irritation or in increased heartburn with anti-inflammatory drugs.  Please keep in mind that muscle relaxer's can cause fatigue and should not be taken while at work or driving.  Back pain is very common.  The pain often gets better over time.  The cause of back pain is usually not dangerous.  Most people can learn to manage their back pain on their own.  Home Care  Stay active.  Start with short walks on flat ground if you can.  Try to walk farther each day.  Do not sit, drive or stand in one place for more than 30 minutes.  Do not stay in bed.  Do not avoid exercise or work.  Activity can help your back heal faster.  Be careful when you bend or lift an object.  Bend at your knees, keep the object close to you, and do not twist.  Sleep on a firm mattress.  Lie on your side, and bend your knees.  If you lie on your back, put a pillow under your knees.  Only take medicines as told by your doctor.  Put ice on the injured area.  Put ice in a plastic bag  Place a towel between your skin and the bag  Leave the ice on for 15-20 minutes, 3-4 times a day for the first 2-3 days. 210 After that, you can switch between ice and heat packs.  Ask your doctor about back exercises or massage.  Avoid feeling anxious or stressed.  Find good ways to deal with stress, such as exercise.  Get Help Right Way If:  Your pain does not go away with rest or medicine.  Your pain does not go away in 1 week.  You have new problems.  You do not  feel well.  The pain spreads into your legs.  You cannot control when you poop (bowel movement) or pee (urinate)  You feel sick to your stomach (nauseous) or throw up (vomit)  You have belly (abdominal) pain.  You feel like you may pass out (faint).  If you develop a fever.  Make Sure you:  Understand these instructions.  Will watch your condition  Will get help right away if you are not doing well or get worse.  Your e-visit answers were reviewed by a board certified advanced clinical practitioner to complete your personal care plan.  Depending on the condition, your plan could have included both over the counter or prescription medications.  If there is a problem please reply  once you have received a response from your provider.  Your safety is important to us.  If you have drug allergies check your prescription carefully.    You can use MyChart to ask questions about today's visit, request a non-urgent call back, or ask for a work or school excuse for 24 hours related to this e-Visit. If it has been greater than 24 hours you will need to follow up with your provider, or enter a new e-Visit to address   those concerns.  You will get an e-mail in the next two days asking about your experience.  I hope that your e-visit has been valuable and will speed your recovery. Thank you for using e-visits.   Greater than 5 minutes, yet less than 10 minutes of time have been spent researching, coordinating, and implementing care for this patient today.  Thank you for the details you included in the comment boxes. Those details are very helpful in determining the best course of treatment for you and help us to provide the best care.  

## 2019-04-08 DIAGNOSIS — Z20828 Contact with and (suspected) exposure to other viral communicable diseases: Secondary | ICD-10-CM | POA: Diagnosis not present

## 2019-05-13 ENCOUNTER — Telehealth: Payer: BLUE CROSS/BLUE SHIELD | Admitting: Family

## 2019-05-13 DIAGNOSIS — H6690 Otitis media, unspecified, unspecified ear: Secondary | ICD-10-CM | POA: Diagnosis not present

## 2019-05-13 MED ORDER — AMOXICILLIN 875 MG PO TABS: 875.0000 mg | ORAL_TABLET | Freq: Two times a day (BID) | ORAL | 0 refills | Status: DC

## 2019-05-13 NOTE — Progress Notes (Signed)
E Visit for  Ear Infection  We are sorry that you are not feeling well. Here is how we plan to help!  It sounds like you may have an ear infection. I have sent Amoxicillin 875 mg that you will take twice a day for 7 days.   Your symptoms should improve over the next 3 days and should resolve in about 7 days.  HOME CARE:   Wash your hands frequently.  Do not place the tip of the bottle on your ear or touch it with your fingers.  You can take Acetominophen 650 mg every 4-6 hours as needed for pain.  If pain is severe or moderate, you can apply a heating pad (set on low) or hot water bottle (wrapped in a towel) to outer ear for 20 minutes.  This will also increase drainage.  Avoid ear plugs  Do not use Q-tips  After showers, help the water run out by tilting your head to one side.  GET HELP RIGHT AWAY IF:   Fever is over 102.2 degrees.  You develop progressive ear pain or hearing loss.  Ear symptoms persist longer than 3 days after treatment.  MAKE SURE YOU:   Understand these instructions.  Will watch your condition.  Will get help right away if you are not doing well or get worse.  TO PREVENT SWIMMER'S EAR:  Use a bathing cap or custom fitted swim molds to keep your ears dry.  Towel off after swimming to dry your ears.  Tilt your head or pull your earlobes to allow the water to escape your ear canal.  If there is still water in your ears, consider using a hairdryer on the lowest setting.  Thank you for choosing an e-visit. Your e-visit answers were reviewed by a board certified advanced clinical practitioner to complete your personal care plan. Depending upon the condition, your plan could have included both over the counter or prescription medications. Please review your pharmacy choice. Be sure that the pharmacy you have chosen is open so that you can pick up your prescription now.  If there is a problem you may message your provider in [place] to have the  prescription routed to another pharmacy. Your safety is important to [place]. If you have drug allergies check your prescription carefully.  For the next 24 hours, you can use MyChart to ask questions about today's visit, request a non-urgent call back, or ask for a work or school excuse from your e-visit provider. You will get an email in the next two days asking about your experience. I hope that your e-visit has been valuable and will speed your recovery.     Approximately 5 minutes was spent documenting and reviewing patient's chart.

## 2019-07-21 DIAGNOSIS — Z20828 Contact with and (suspected) exposure to other viral communicable diseases: Secondary | ICD-10-CM | POA: Diagnosis not present

## 2019-08-12 DIAGNOSIS — Z20828 Contact with and (suspected) exposure to other viral communicable diseases: Secondary | ICD-10-CM | POA: Diagnosis not present

## 2019-09-05 ENCOUNTER — Ambulatory Visit (INDEPENDENT_AMBULATORY_CARE_PROVIDER_SITE_OTHER)
Admission: RE | Admit: 2019-09-05 | Discharge: 2019-09-05 | Disposition: A | Discharge status: Home / Self Care | Payer: BC Managed Care – PPO | Source: Ambulatory Visit

## 2019-09-05 DIAGNOSIS — H9201 Otalgia, right ear: Secondary | ICD-10-CM

## 2019-09-05 DIAGNOSIS — J3089 Other allergic rhinitis: Secondary | ICD-10-CM | POA: Diagnosis not present

## 2019-09-05 NOTE — ED Provider Notes (Signed)
Virtual Visit via Video Note:  [name]  initiated request for Telemedicine visit with [facility] team. I connected with [name]  on 09/05/2019 at 1:29 PM  for a synchronized telemedicine visit using a video enabled HIPPA compliant telemedicine application. I verified that I am speaking with [name]  using two identifiers. [place] Hall-Potvin, PA-C  was physically located in a [facility]gent care site and IOKEPA GEFFRE was located at a different location.   The limitations of evaluation and management by telemedicine as well as the availability of in-person appointments were discussed. Patient was informed that he  may incur a bill ( including co-pay) for this virtual visit encounter. Laroy S Hall  expressed understanding and gave verbal consent to proceed with virtual visit.     History of Present Illness:Jonathan Kaufman  is a 24 y.o. male presents with 2-day course of right ear pain.  States he has had an ear infection before, last was about a year ago, concerned that this feels similar.  Has tried Mucinex which helps with ear pain and postnasal drip which she is also been experiencing recently.  Denies trauma to the area, change in hearing, discharge, fever, sinus pain, throat pain, cough, chest pain.  No recent sick contacts.  Does endorse history of severe allergies: Not currently take anything for this.   ROI as per HPI  Past Medical History:  Diagnosis Date  . Elevated ALT measurement 11/28/2018  . Mixed hyperlipidemia 11/26/2018    Allergies  Allergen Reactions  . Banana   . Other     Cats and bunnies        Observations/Objective: 24 year old male sitting in no acute distress, nontoxic.  Patient is able to speak in full sentences without coughing, sneezing, wheezing.  No obvious facial swelling, ear bulging.  Assessment and Plan: Ear pain concerning for eustachian tube dysfunction second to likely allergic rhinitis.  Will trial supportive treatment with  intranasal steroid, antihistamine and have patient follow-up in person for persistent, worsening symptoms.  Return precautions discussed, patient verbalized understanding and is agreeable to plan.  Follow Up Instructions: Patient to seek in-person evaluation for persistent, worsening symptoms.   I discussed the assessment and treatment plan with the patient. The patient was provided an opportunity to ask questions and all were answered. The patient agreed with the plan and demonstrated an understanding of the instructions.   The patient was advised to call back or seek an in-person evaluation if the symptoms worsen or if the condition fails to improve as anticipated.  I provided 15 minutes of non-face-to-face time during this encounter.    [place] Hall-Potvin, PA-C  09/05/2019 1:29 PM        Hall-Potvin, [place], PA-C 09/05/19 1529

## 2019-09-05 NOTE — Discharge Instructions (Addendum)
Good to meet you! Important to take allergy pill daily. Use flonase or nasacort, 2 sprays in each nostril, daily. Recommend you are evaluated in person for an exam for persistent/worsening symptoms.

## 2019-10-15 NOTE — Patient Instructions (Signed)
Preventive Care 19-24 Years Old, Male Preventive care refers to lifestyle choices and visits with your health care provider that can promote health and wellness. This includes:  A yearly physical exam. This is also called an annual well check.  Regular dental and eye exams.  Immunizations.  Screening for certain conditions.  Healthy lifestyle choices, such as eating a healthy diet, getting regular exercise, not using drugs or products that contain nicotine and tobacco, and limiting alcohol use. What can I expect for my preventive care visit? Physical exam Your health care provider will check:  Height and weight. These may be used to calculate body mass index (BMI), which is a measurement that tells if you are at a healthy weight.  Heart rate and blood pressure.  Your skin for abnormal spots. Counseling Your health care provider may ask you questions about:  Alcohol, tobacco, and drug use.  Emotional well-being.  Home and relationship well-being.  Sexual activity.  Eating habits.  Work and work Statistician. What immunizations do I need?  Influenza (flu) vaccine  This is recommended every year. Tetanus, diphtheria, and pertussis (Tdap) vaccine  You may need a Td booster every 10 years. Varicella (chickenpox) vaccine  You may need this vaccine if you have not already been vaccinated. Human papillomavirus (HPV) vaccine  If recommended by your health care provider, you may need three doses over 6 months. Measles, mumps, and rubella (MMR) vaccine  You may need at least one dose of MMR. You may also need a second dose. Meningococcal conjugate (MenACWY) vaccine  One dose is recommended if you are 45-76 years old and a Market researcher living in a residence hall, or if you have one of several medical conditions. You may also need additional booster doses. Pneumococcal conjugate (PCV13) vaccine  You may need this if you have certain conditions and were not  previously vaccinated. Pneumococcal polysaccharide (PPSV23) vaccine  You may need one or two doses if you smoke cigarettes or if you have certain conditions. Hepatitis A vaccine  You may need this if you have certain conditions or if you travel or work in places where you may be exposed to hepatitis A. Hepatitis B vaccine  You may need this if you have certain conditions or if you travel or work in places where you may be exposed to hepatitis B. Haemophilus influenzae type b (Hib) vaccine  You may need this if you have certain risk factors. You may receive vaccines as individual doses or as more than one vaccine together in one shot (combination vaccines). Talk with your health care provider about the risks and benefits of combination vaccines. What tests do I need? Blood tests  Lipid and cholesterol levels. These may be checked every 5 years starting at age 17.  Hepatitis C test.  Hepatitis B test. Screening   Diabetes screening. This is done by checking your blood sugar (glucose) after you have not eaten for a while (fasting).  Sexually transmitted disease (STD) testing. Talk with your health care provider about your test results, treatment options, and if necessary, the need for more tests. Follow these instructions at home: Eating and drinking   Eat a diet that includes fresh fruits and vegetables, whole grains, lean protein, and low-fat dairy products.  Take vitamin and mineral supplements as recommended by your health care provider.  Do not drink alcohol if your health care provider tells you not to drink.  If you drink alcohol: ? Limit how much you have to 0-2  drinks a day. ? Be aware of how much alcohol is in your drink. In the [place] one drink equals one 12 oz bottle of beer (355 mL), one 5 oz glass of wine (148 mL), or one 1 oz glass of hard liquor (44 mL). Lifestyle  Take daily care of your teeth and gums.  Stay active. Exercise for at least 30 minutes on 5 or  more days each week.  Do not use any products that contain nicotine or tobacco, such as cigarettes, e-cigarettes, and chewing tobacco. If you need help quitting, ask your health care provider.  If you are sexually active, practice safe sex. Use a condom or other form of protection to prevent STIs (sexually transmitted infections). What's next?  Go to your health care provider once a year for a well check visit.  Ask your health care provider how often you should have your eyes and teeth checked.  Stay up to date on all vaccines. This information is not intended to replace advice given to you by your health care provider. Make sure you discuss any questions you have with your health care provider. Document Revised: 07/03/2018 Document Reviewed: 07/03/2018 Elsevier Patient Education  2020 [employer].

## 2019-10-15 NOTE — Progress Notes (Signed)
Subjective:    Patient ID: Jonathan Kaufman, male    DOB: 08/28/1995, 24 y.o.   MRN: [id number]  HPI Chief Complaint  Patient presents with  . cpe    cpe, wants testosterone rechecked and allergy blood test panel,    He is here for a complete physical exam. Previous medical care: Last CPE: 2019  Other providers: Therapist   States he had very dry and pruritic skin on his lower legs in December 2020 and he scratched his legs a lot. States he developed dark areas on his legs that are still present. No longer dry or pruritic.   He reports having fatigue and has had this for several months.  Hx of low testosterone and would like to have this rechecked.  States he has a much healthier diet now and is more active in general.   States he would like to lose weight. He has gained since his last visit.   States his energy level is fine. Normal libido   Plans to get the Covid vaccine in the next week.   Hx of allergy to banana and would like to have a test to see if he is actually allergic. No recent allergic reactions.    Social history: Lives with his male spouse, works as a Emergency planning/management officer for a Banker at [employer].  Denies smoking, drinking alcohol, drug use   Immunizations: Tdap Utd  Health maintenance:  Last Dental Exam: 2019  Last Eye Exam: 2019  Wears seatbelt always, uses sunscreen, smoke detectors in home and functioning, does not text while driving, feels safe in home environment.  Reviewed allergies, medications, past medical, surgical, family, and social history.   Review of Systems Review of Systems Constitutional: -fever, -chills, -sweats, -unexpected weight change,-fatigue ENT: -runny nose, -ear pain, -sore throat Cardiology:  -chest pain, -palpitations, -edema Respiratory: -cough, -shortness of breath, -wheezing Gastroenterology: -abdominal pain, -nausea, -vomiting, -diarrhea, -constipation  Hematology: -bleeding or bruising  problems Musculoskeletal: -arthralgias, -myalgias, -joint swelling, -back pain Ophthalmology: -vision changes Urology: -dysuria, -difficulty urinating, -hematuria, -urinary frequency, -urgency Neurology: -headache, -weakness, -tingling, -numbness       Objective:   Physical Exam BP 110/70   Pulse 89   Temp 99.3 F (37.4 C)   Ht 6\' 4"  (1.93 m)   Wt (!) 321 lb 6.4 oz (145.8 kg)   BMI 39.12 kg/m   General Appearance:    Alert, cooperative, no distress, appears stated age  Head:    Normocephalic, without obvious abnormality, atraumatic  Eyes:    PERRL, conjunctiva/corneas clear, EOM's intact   Ears:    Normal TM's and external ear canals  Nose:   Mask in place   Throat:   Mask in place   Neck:   Supple, no lymphadenopathy;  thyroid:  no   enlargement/tenderness/nodules; no carotid   bruit or JVD  Back:    Spine nontender, no curvature, ROM normal, no CVA     tenderness  Lungs:     Clear to auscultation bilaterally without wheezes, rales or     ronchi; respirations unlabored  Chest Wall:    No tenderness or deformity   Heart:    Regular rate and rhythm, S1 and S2 normal, no murmur, rub   or gallop  Breast Exam:    No chest wall tenderness, masses or gynecomastia  Abdomen:     Soft, non-tender, nondistended, normoactive bowel sounds,    no masses, no hepatosplenomegaly  Genitalia:    Declines  Rectal:   Deferred due to age <40 and lack of symptoms  Extremities:   No clubbing, cyanosis or edema  Pulses:   2+ and symmetric all extremities  Skin:   Skin color, texture, turgor normal, no rashes or lesions  Lymph nodes:   Cervical, supraclavicular, and axillary nodes normal  Neurologic:   CNII-XII intact, normal strength, sensation and gait; reflexes 2+ and symmetric throughout          Psych:   Normal mood, affect, hygiene and grooming.         Assessment & Plan:  Routine general medical examination at a health care facility - Plan: CBC with Differential/Platelet, Comprehensive  metabolic panel, TSH, T4, free, T3, Lipid panel  Obesity (BMI 30-39.9) - Plan: Lipid panel  Allergy to food - Plan: Allergen Profile, Food-Fruit  Mixed hyperlipidemia - Plan: Lipid panel  Low serum testosterone level - Plan: Testosterone  Here for a fasting CPE.  Preventive healthcare discussed. Declines genital exam and he will do self testicular exams. Counseling on testicular cancer in his age group.  Counseling on healthy diet, ways to increase metabolism including intermittent fasting, smaller more frequent meals, increasing his exercise.  We will need to recheck his testosterone and he will return between 8-10 am one day next week. Discussed relationship between obesity and low testosterone.  Check labs including fruit allergy panel per patient request and follow up.

## 2019-10-16 ENCOUNTER — Other Ambulatory Visit: Payer: Self-pay

## 2019-10-16 ENCOUNTER — Encounter: Payer: Self-pay | Admitting: Family Medicine

## 2019-10-16 ENCOUNTER — Ambulatory Visit (INDEPENDENT_AMBULATORY_CARE_PROVIDER_SITE_OTHER): Payer: BC Managed Care – PPO | Admitting: Family Medicine

## 2019-10-16 VITALS — BP 110/70 | HR 89 | Temp 99.3°F | Ht 76.0 in | Wt 321.4 lb

## 2019-10-16 DIAGNOSIS — E669 Obesity, unspecified: Secondary | ICD-10-CM

## 2019-10-16 DIAGNOSIS — E782 Mixed hyperlipidemia: Secondary | ICD-10-CM | POA: Diagnosis not present

## 2019-10-16 DIAGNOSIS — Z91018 Allergy to other foods: Secondary | ICD-10-CM

## 2019-10-16 DIAGNOSIS — Z0000 Encounter for general adult medical examination without abnormal findings: Secondary | ICD-10-CM

## 2019-10-16 DIAGNOSIS — R7989 Other specified abnormal findings of blood chemistry: Secondary | ICD-10-CM

## 2019-10-18 DIAGNOSIS — R7989 Other specified abnormal findings of blood chemistry: Secondary | ICD-10-CM | POA: Insufficient documentation

## 2019-10-20 ENCOUNTER — Encounter: Payer: Self-pay | Admitting: Family Medicine

## 2019-10-20 LAB — TSH: TSH: 1.74 u[IU]/mL (ref 0.450–4.500)

## 2019-10-20 LAB — CBC WITH DIFFERENTIAL/PLATELET
Basophils Absolute: 0 10*3/uL (ref 0.0–0.2)
Basos: 1 %
EOS (ABSOLUTE): 0.4 10*3/uL (ref 0.0–0.4)
Eos: 6 %
Hematocrit: 50.3 % (ref 37.5–51.0)
Hemoglobin: 16.8 g/dL (ref 13.0–17.7)
Immature Grans (Abs): 0 10*3/uL (ref 0.0–0.1)
Immature Granulocytes: 0 %
Lymphocytes Absolute: 2.8 10*3/uL (ref 0.7–3.1)
Lymphs: 38 %
MCH: 28.9 pg (ref 26.6–33.0)
MCHC: 33.4 g/dL (ref 31.5–35.7)
MCV: 86 fL (ref 79–97)
Monocytes Absolute: 0.7 10*3/uL (ref 0.1–0.9)
Monocytes: 9 %
Neutrophils Absolute: 3.4 10*3/uL (ref 1.4–7.0)
Neutrophils: 46 %
Platelets: 307 10*3/uL (ref 150–450)
RBC: 5.82 x10E6/uL — ABNORMAL HIGH (ref 4.14–5.80)
RDW: 13.1 % (ref 11.6–15.4)
WBC: 7.4 10*3/uL (ref 3.4–10.8)

## 2019-10-20 LAB — ALLERGEN PROFILE, FOOD-FRUIT
Allergen Apple, IgE: 3.71 kU/L — AB
Allergen Banana IgE: 1.47 kU/L — AB
Allergen Grape IgE: 2.13 kU/L — AB
Allergen Pear IgE: 1.33 kU/L — AB
Allergen, Peach f95: 2.65 kU/L — AB

## 2019-10-20 LAB — COMPREHENSIVE METABOLIC PANEL
ALT: 42 IU/L (ref 0–44)
AST: 27 IU/L (ref 0–40)
Albumin/Globulin Ratio: 1.9 (ref 1.2–2.2)
Albumin: 4.9 g/dL (ref 4.1–5.2)
Alkaline Phosphatase: 71 IU/L (ref 39–117)
BUN/Creatinine Ratio: 8 — ABNORMAL LOW (ref 9–20)
BUN: 7 mg/dL (ref 6–20)
Bilirubin Total: 0.5 mg/dL (ref 0.0–1.2)
CO2: 22 mmol/L (ref 20–29)
Calcium: 9.6 mg/dL (ref 8.7–10.2)
Chloride: 99 mmol/L (ref 96–106)
Creatinine, Ser: 0.84 mg/dL (ref 0.76–1.27)
GFR calc Af Amer: 142 mL/min/{1.73_m2} (ref >59)
GFR calc non Af Amer: 123 mL/min/{1.73_m2} (ref >59)
Globulin, Total: 2.6 g/dL (ref 1.5–4.5)
Glucose: 88 mg/dL (ref 65–99)
Potassium: 4 mmol/L (ref 3.5–5.2)
Sodium: 137 mmol/L (ref 134–144)
Total Protein: 7.5 g/dL (ref 6.0–8.5)

## 2019-10-20 LAB — LIPID PANEL
Chol/HDL Ratio: 5.6 ratio — ABNORMAL HIGH (ref 0.0–5.0)
Cholesterol, Total: 222 mg/dL — ABNORMAL HIGH (ref 100–199)
HDL: 40 mg/dL (ref >39)
LDL Chol Calc (NIH): 155 mg/dL — ABNORMAL HIGH (ref 0–99)
Triglycerides: 147 mg/dL (ref 0–149)
VLDL Cholesterol Cal: 27 mg/dL (ref 5–40)

## 2019-10-20 LAB — T4, FREE: Free T4: 1.3 ng/dL (ref 0.82–1.77)

## 2019-10-20 LAB — T3: T3, Total: 146 ng/dL (ref 71–180)

## 2019-11-06 ENCOUNTER — Other Ambulatory Visit: Payer: Self-pay

## 2019-11-06 ENCOUNTER — Other Ambulatory Visit: Payer: BC Managed Care – PPO

## 2019-11-06 DIAGNOSIS — R5383 Other fatigue: Secondary | ICD-10-CM | POA: Diagnosis not present

## 2019-11-06 DIAGNOSIS — R7989 Other specified abnormal findings of blood chemistry: Secondary | ICD-10-CM

## 2019-11-06 DIAGNOSIS — E669 Obesity, unspecified: Secondary | ICD-10-CM | POA: Diagnosis not present

## 2019-11-07 ENCOUNTER — Encounter: Payer: Self-pay | Admitting: Family Medicine

## 2019-11-07 LAB — TESTOSTERONE: Testosterone: 277 ng/dL (ref 264–916)

## 2019-11-24 ENCOUNTER — Other Ambulatory Visit: Payer: Self-pay | Admitting: Family Medicine

## 2019-11-24 ENCOUNTER — Telehealth: Payer: Self-pay | Admitting: Family Medicine

## 2019-11-24 DIAGNOSIS — R7989 Other specified abnormal findings of blood chemistry: Secondary | ICD-10-CM

## 2019-11-24 DIAGNOSIS — E669 Obesity, unspecified: Secondary | ICD-10-CM

## 2019-11-24 NOTE — Telephone Encounter (Signed)
Shraga scheduled lab appointment for 5/28, can you put orders in system please. Saw in notes you wanted him to come back for labs but no future orders

## 2019-11-24 NOTE — Progress Notes (Signed)
   Subjective:    Patient ID: Jonathan Kaufman, male    DOB: 10/21/1995, 24 y.o.   MRN: [id number]  HPI    Review of Systems     Objective:   Physical Exam        Assessment & Plan:

## 2019-11-24 NOTE — Telephone Encounter (Signed)
Orders are in for future lab visit. Please make sure he knows to come in between 8 am and 10 am that day.

## 2019-11-25 NOTE — Telephone Encounter (Signed)
Pt is scheduled for 9am

## 2019-12-17 ENCOUNTER — Encounter: Payer: Self-pay | Admitting: Family Medicine

## 2019-12-17 DIAGNOSIS — R5383 Other fatigue: Secondary | ICD-10-CM | POA: Insufficient documentation

## 2019-12-18 ENCOUNTER — Other Ambulatory Visit: Payer: Self-pay

## 2019-12-18 ENCOUNTER — Other Ambulatory Visit: Payer: BC Managed Care – PPO

## 2019-12-18 DIAGNOSIS — E669 Obesity, unspecified: Secondary | ICD-10-CM | POA: Diagnosis not present

## 2019-12-18 DIAGNOSIS — R7989 Other specified abnormal findings of blood chemistry: Secondary | ICD-10-CM

## 2019-12-21 LAB — TESTOSTERONE, FREE, TOTAL, SHBG
Sex Hormone Binding: 20.2 nmol/L (ref 16.5–55.9)
Testosterone, Free: 11.8 pg/mL (ref 9.3–26.5)
Testosterone: 326 ng/dL (ref 264–916)

## 2019-12-21 LAB — FSH/LH
FSH: 1.5 m[IU]/mL (ref 1.5–12.4)
LH: 3.8 m[IU]/mL (ref 1.7–8.6)

## 2020-01-22 ENCOUNTER — Other Ambulatory Visit: Payer: Self-pay

## 2020-01-22 ENCOUNTER — Encounter: Payer: Self-pay | Admitting: Emergency Medicine

## 2020-01-22 ENCOUNTER — Ambulatory Visit
Admission: EM | Admit: 2020-01-22 | Discharge: 2020-01-22 | Disposition: A | Discharge status: Home / Self Care | Payer: 59 | Attending: Emergency Medicine | Admitting: Emergency Medicine

## 2020-01-22 DIAGNOSIS — R590 Localized enlarged lymph nodes: Secondary | ICD-10-CM | POA: Diagnosis not present

## 2020-01-22 MED ORDER — FLUTICASONE PROPIONATE 50 MCG/ACT NA SUSP
1.0000 | Freq: Every day | NASAL | 0 refills | Status: DC
Start: 2020-01-22 — End: 2020-08-23

## 2020-01-22 MED ORDER — CETIRIZINE HCL 10 MG PO TABS: 10.0000 mg | ORAL_TABLET | Freq: Every day | ORAL | 0 refills | Status: DC

## 2020-01-22 NOTE — Discharge Instructions (Signed)

## 2020-01-22 NOTE — ED Provider Notes (Signed)
[facility]    CSN: [id number] Arrival date & time: 01/22/20  0935      History   Chief Complaint Chief Complaint  Patient presents with  . Neck Pain    HPI Jonathan Kaufman is a 24 y.o. male with history of obesity presenting for left-sided neck pain since Tuesday.  Patient provides history: States neck pain has been hurting more consistently since onset.  Worse with movement.  No fever, difficulty breathing or swelling, chest pain, palpitations.  Has tried OTC Sudafed without relief.  No injury/inciting event.  Patient is currently sexually active with 1 male partner.  No dental pain, ear pain, recent URI/illness.  No known sick contacts.  No change in appetite or activity level, urinary or bowel habit.   Past Medical History:  Diagnosis Date  . Elevated ALT measurement 11/28/2018  . Mixed hyperlipidemia 11/26/2018    Patient Active Problem List   Diagnosis Date Noted  . Fatigue 12/17/2019  . 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 Elevated ALT measurement 11/28/2018  . Depression, recurrent (HCC) 11/26/2018  . Situational anxiety 11/26/2018  . Mixed hyperlipidemia 11/26/2018  . Family history of thyroid disease 05/30/2018  . Obesity (BMI 30-39.9) 05/30/2018  . High risk sexual behavior 03/07/2017  . Not immune to hepatitis B virus 03/07/2017    Past Surgical History:  Procedure Laterality Date  . MOUTH SURGERY         Home Medications    Prior to Admission medications   Medication Sig Start Date End Date Taking? Authorizing Provider  cetirizine (ZYRTEC ALLERGY) 10 MG tablet Take 1 tablet (10 mg total) by mouth daily. 01/22/20   Hall-Potvin, [place], PA-C  fluticasone (FLONASE) 50 MCG/ACT nasal spray Place 1 spray into both nostrils daily. 01/22/20   Hall-Potvin, [place], PA-C    Family History Family History  Problem Relation Age of Onset  . Depression Mother   . Bipolar disorder Father   . Schizophrenia Father   . Thyroid disease Paternal  Grandmother     Social History Social History   Tobacco Use  . Smoking status: Never Smoker  . Smokeless tobacco: Never Used  Vaping Use  . Vaping Use: Never used  Substance Use Topics  . Alcohol use: Not Currently  . Drug use: No     Allergies   Banana and Other   Review of Systems As per HPI   Physical Exam Triage Vital Signs ED Triage Vitals  Enc Vitals Group     BP      Pulse      Resp      Temp      Temp src      SpO2      Weight      Height      Head Circumference      Peak Flow      Pain Score      Pain Loc      Pain Edu?      Excl. in GC?    No data found.  Updated Vital Signs BP 108/74 (BP Location: Left Arm)   Pulse 94   Temp 98.4 F (36.9 C) (Oral)   Resp 18   SpO2 94%   Visual Acuity Right Eye Distance:   Left Eye Distance:   Bilateral Distance:    Right Eye Near:   Left Eye Near:    Bilateral Near:     Physical Exam Constitutional:      General: He is  not in acute distress. HENT:     Head: Normocephalic and atraumatic.     Right Ear: Tympanic membrane, ear canal and external ear normal.     Left Ear: Tympanic membrane, ear canal and external ear normal.     Nose: No nasal deformity, congestion or rhinorrhea.     Comments: Turbinates nonedematous bilaterally with pink mucosa    Mouth/Throat:     Mouth: Mucous membranes are moist.     Tongue: Tongue does not deviate from midline.     Pharynx: Oropharynx is clear. Uvula midline. No oropharyngeal exudate or posterior oropharyngeal erythema.     Comments: No tonsillar hypertrophy or exudate.  No pharyngeal erythema, cobblestoning.  Uvula midline, nonedematous Eyes:     General: No scleral icterus.    Conjunctiva/sclera: Conjunctivae normal.     Pupils: Pupils are equal, round, and reactive to light.  Neck:     Comments: Mild swollen lymph node of left anterior cervical chain, superior aspect.  Trachea midline, negative JVD. Cardiovascular:     Rate and Rhythm: Normal rate and  regular rhythm.  Pulmonary:     Effort: Pulmonary effort is normal. No respiratory distress.     Breath sounds: No wheezing.  Musculoskeletal:     Cervical back: Normal range of motion and neck supple. Tenderness present. No muscular tenderness.  Lymphadenopathy:     Cervical: Cervical adenopathy present.  Skin:    Capillary Refill: Capillary refill takes less than 2 seconds.     Findings: No rash.  Neurological:     General: No focal deficit present.     Mental Status: He is alert.      UC Treatments / Results  Labs (all labs ordered are listed, but only abnormal results are displayed) Labs Reviewed - No data to display  EKG   Radiology No results found.  Procedures Procedures (including critical care time)  Medications Ordered in UC Medications - No data to display  Initial Impression / Assessment and Plan / UC Course  I have reviewed the triage vital signs and the nursing notes.  Pertinent labs & imaging results that were available during my care of the patient were reviewed by me and considered in my medical decision making (see chart for details).     Afebrile, nontoxic, without airway compromise.  Exam benign.  Will treat supportively as outlined below, monitor.  Return precautions discussed, patient verbalized understanding and is agreeable to plan. Final Clinical Impressions(s) / UC Diagnoses   Final diagnoses:  Lymphadenopathy, cervical     Discharge Instructions     Recommend RICE: rest, ice, compression, elevation as needed for pain.    Heat therapy (hot compress, warm wash rag, hot showers, etc.) can help relax muscles and soothe muscle aches. Cold therapy (ice packs) can be used to help swelling both after injury and after prolonged use of areas of chronic pain/aches.  For pain: recommend 350 mg-1000 mg of Tylenol (acetaminophen) and/or 200 mg - 800 mg of Advil (ibuprofen, Motrin) every 8 hours as needed.  May alternate between the two throughout  the day as they are generally safe to take together.  DO NOT exceed more than 3000 mg of Tylenol or 3200 mg of ibuprofen in a 24 hour period as this could damage your stomach, kidneys, liver, or increase your bleeding risk.    ED Prescriptions    Medication Sig Dispense Auth. Provider   cetirizine (ZYRTEC ALLERGY) 10 MG tablet Take 1 tablet (10 mg total) by  mouth daily. 30 tablet Hall-Potvin, [place], PA-C   fluticasone (FLONASE) 50 MCG/ACT nasal spray Place 1 spray into both nostrils daily. 16 g Hall-Potvin, [place], PA-C     PDMP not reviewed this encounter.   Hall-Potvin, [place], [place] 01/22/20 1005

## 2020-01-22 NOTE — ED Triage Notes (Addendum)
Patient presents today with complaints of left side neck pain that began on Tuesday. Patient states the neck pain has been constant hurting more when having to move his neck in any position. Patient states he has only tried OTC Sudafed with no relief. Patient states he has not injured himself.  Patient denies: fever, nasal congestion, ear pain, sinus drainage, sore throat

## 2020-03-18 ENCOUNTER — Encounter: Payer: Self-pay | Admitting: Family Medicine

## 2020-03-24 DIAGNOSIS — Z20828 Contact with and (suspected) exposure to other viral communicable diseases: Secondary | ICD-10-CM | POA: Diagnosis not present

## 2020-04-12 ENCOUNTER — Encounter: Payer: Self-pay | Admitting: Family Medicine

## 2020-08-23 ENCOUNTER — Other Ambulatory Visit: Payer: Self-pay

## 2020-08-23 ENCOUNTER — Encounter (INDEPENDENT_AMBULATORY_CARE_PROVIDER_SITE_OTHER): Payer: Self-pay | Admitting: Primary Care

## 2020-08-23 ENCOUNTER — Ambulatory Visit (INDEPENDENT_AMBULATORY_CARE_PROVIDER_SITE_OTHER): Payer: 59 | Admitting: Primary Care

## 2020-08-23 VITALS — BP 129/81 | HR 99 | Temp 97.5°F | Ht 76.0 in | Wt 360.6 lb

## 2020-08-23 DIAGNOSIS — Z7689 Persons encountering health services in other specified circumstances: Secondary | ICD-10-CM | POA: Diagnosis not present

## 2020-08-23 DIAGNOSIS — R030 Elevated blood-pressure reading, without diagnosis of hypertension: Secondary | ICD-10-CM | POA: Diagnosis not present

## 2020-08-23 DIAGNOSIS — F339 Major depressive disorder, recurrent, unspecified: Secondary | ICD-10-CM

## 2020-08-23 DIAGNOSIS — E782 Mixed hyperlipidemia: Secondary | ICD-10-CM | POA: Diagnosis not present

## 2020-08-23 NOTE — Progress Notes (Signed)
New Patient Office Visit  Subjective:  Patient ID: Jonathan Kaufman, male    DOB: Aug 31, 1995  Age: 25 y.o. MRN: [id number]  CC:  Chief Complaint  Patient presents with  . New Patient (Initial Visit)    HPI Mr. Jonathan Kaufman is a 25 year old morbid obese male who presents for establishment of care.   Past Medical History:  Diagnosis Date  . Elevated ALT measurement 11/28/2018  . Mixed hyperlipidemia 11/26/2018    Past Surgical History:  Procedure Laterality Date  . MOUTH SURGERY      Family History  Problem Relation Age of Onset  . Depression Mother   . Bipolar disorder Father   . Schizophrenia Father   . Thyroid disease Paternal Grandmother     Social History   Socioeconomic History  . Marital status: Married    Spouse name: Not on file  . Number of children: Not on file  . Years of education: Not on file  . Highest education level: Not on file  Occupational History  . Not on file  Tobacco Use  . Smoking status: Never Smoker  . Smokeless tobacco: Never Used  Vaping Use  . Vaping Use: Never used  Substance and Sexual Activity  . Alcohol use: Not Currently  . Drug use: No  . Sexual activity: Not on file  Other Topics Concern  . Not on file  Social History Narrative  . Not on file   Social Determinants of Health   Financial Resource Strain: Not on file  Food Insecurity: Not on file  Transportation Needs: Not on file  Physical Activity: Not on file  Stress: Not on file  Social Connections: Not on file  Intimate Partner Violence: Not on file    ROS Review of Systems  Psychiatric/Behavioral: Positive for sleep disturbance.  All other systems reviewed and are negative.   Objective:   Today's Vitals: BP 129/81 (BP Location: Right Arm, Patient Position: Sitting, Cuff Size: Large)   Pulse 99   Temp (!) 97.5 F (36.4 C) (Temporal)   Ht $R'6\' 4"'Vl$  (1.93 m)   Wt (!) 360 lb 9.6 oz (163.6 kg)   SpO2 95%   BMI 43.89 kg/m   Physical Exam Vitals reviewed.   Constitutional:      Appearance: He is obese.     Comments: morbid  HENT:     Head: Normocephalic.     Right Ear: Tympanic membrane and external ear normal.     Left Ear: Tympanic membrane and external ear normal.     Nose: Nose normal.  Eyes:     Extraocular Movements: Extraocular movements intact.     Pupils: Pupils are equal, round, and reactive to light.  Cardiovascular:     Rate and Rhythm: Normal rate and regular rhythm.  Pulmonary:     Effort: Pulmonary effort is normal.     Breath sounds: Normal breath sounds.  Abdominal:     General: Bowel sounds are normal. There is distension.     Palpations: Abdomen is soft.  Musculoskeletal:        General: Normal range of motion.     Cervical back: Normal range of motion and neck supple.  Skin:    General: Skin is warm and dry.  Neurological:     Mental Status: He is alert and oriented to person, place, and time.  Psychiatric:        Mood and Affect: Mood normal.        Behavior:  Behavior normal.        Thought Content: Thought content normal.        Judgment: Judgment normal.     Assessment & Plan:  Jonathan Kaufman was seen today for new patient (initial visit).  Diagnoses and all orders for this visit:  Encounter to establish care Establish care with new provider   Elevated blood pressure, situational Followed by a nutritionist which will help with healthy eating habits and exercising . Discussed Dx for HTX and first visit anxious Bp very close to normal. Not a concern at this time -     CMP14+EGFR; Future -     CBC with Differential/Platelet; Future  Mixed hyperlipidemia -     Lipid panel; Future  Depression, recurrent ([place]) [place] Office Visit from 11/26/2018 in [place]  PHQ-9 Total Score 15    Followed out side of practice.   Outpatient Encounter Medications as of 08/23/2020  Medication Sig  . [DISCONTINUED] cetirizine (ZYRTEC ALLERGY) 10 MG tablet Take 1 tablet (10 mg total) by mouth daily.   . [DISCONTINUED] fluticasone (FLONASE) 50 MCG/ACT nasal spray Place 1 spray into both nostrils daily.   No facility-administered encounter medications on file as of 08/23/2020.    Follow-up: Return in about 6 months (around 02/20/2021) for hyperlipidemia/weight labs. Kerin Perna, NP

## 2020-08-23 NOTE — Patient Instructions (Signed)
Managing Your Hypertension Hypertension, also called high blood pressure, is when the force of the blood pressing against the walls of the arteries is too strong. Arteries are blood vessels that carry blood from your heart throughout your body. Hypertension forces the heart to work harder to pump blood and may cause the arteries to become narrow or stiff. Understanding blood pressure readings Your personal target blood pressure may vary depending on your medical conditions, your age, and other factors. A blood pressure reading includes a higher number over a lower number. Ideally, your blood pressure should be below 120/80. You should know that:  The first, or top, number is called the systolic pressure. It is a measure of the pressure in your arteries as your heart beats.  The second, or bottom number, is called the diastolic pressure. It is a measure of the pressure in your arteries as the heart relaxes. Blood pressure is classified into four stages. Based on your blood pressure reading, your health care provider may use the following stages to determine what type of treatment you need, if any. Systolic pressure and diastolic pressure are measured in a unit called mmHg. Normal  Systolic pressure: below 120.  Diastolic pressure: below 80. Elevated  Systolic pressure: 120-129.  Diastolic pressure: below 80. Hypertension stage 1  Systolic pressure: 130-139.  Diastolic pressure: 80-89. Hypertension stage 2  Systolic pressure: 140 or above.  Diastolic pressure: 90 or above. How can this condition affect me? Managing your hypertension is an important responsibility. Over time, hypertension can damage the arteries and decrease blood flow to important parts of the body, including the brain, heart, and kidneys. Having untreated or uncontrolled hypertension can lead to:  A heart attack.  A stroke.  A weakened blood vessel (aneurysm).  Heart failure.  Kidney damage.  Eye  damage.  Metabolic syndrome.  Memory and concentration problems.  Vascular dementia. What actions can I take to manage this condition? Hypertension can be managed by making lifestyle changes and possibly by taking medicines. Your health care provider will help you make a plan to bring your blood pressure within a normal range. Nutrition  Eat a diet that is high in fiber and potassium, and low in salt (sodium), added sugar, and fat. An example eating plan is called the Dietary Approaches to Stop Hypertension (DASH) diet. To eat this way: ? Eat plenty of fresh fruits and vegetables. Try to fill one-half of your plate at each meal with fruits and vegetables. ? Eat whole grains, such as whole-wheat pasta, brown rice, or whole-grain bread. Fill about one-fourth of your plate with whole grains. ? Eat low-fat dairy products. ? Avoid fatty cuts of meat, processed or cured meats, and poultry with skin. Fill about one-fourth of your plate with lean proteins such as fish, chicken without skin, beans, eggs, and tofu. ? Avoid pre-made and processed foods. These tend to be higher in sodium, added sugar, and fat.  Reduce your daily sodium intake. Most people with hypertension should eat less than 1,500 mg of sodium a day.   Lifestyle  Work with your health care provider to maintain a healthy body weight or to lose weight. Ask what an ideal weight is for you.  Get at least 30 minutes of exercise that causes your heart to beat faster (aerobic exercise) most days of the week. Activities may include walking, swimming, or biking.  Include exercise to strengthen your muscles (resistance exercise), such as weight lifting, as part of your weekly exercise routine. Try   to do these types of exercises for 30 minutes at least 3 days a week.  Do not use any products that contain nicotine or tobacco, such as cigarettes, e-cigarettes, and chewing tobacco. If you need help quitting, ask your health care  provider.  Control any long-term (chronic) conditions you have, such as high cholesterol or diabetes.  Identify your sources of stress and find ways to manage stress. This may include meditation, deep breathing, or making time for fun activities.   Alcohol use  Do not drink alcohol if: ? Your health care provider tells you not to drink. ? You are pregnant, may be pregnant, or are planning to become pregnant.  If you drink alcohol: ? Limit how much you use to:  0-1 drink a day for women.  0-2 drinks a day for men. ? Be aware of how much alcohol is in your drink. In the [place] one drink equals one 12 oz bottle of beer (355 mL), one 5 oz glass of wine (148 mL), or one 1 oz glass of hard liquor (44 mL). Medicines Your health care provider may prescribe medicine if lifestyle changes are not enough to get your blood pressure under control and if:  Your systolic blood pressure is 130 or higher.  Your diastolic blood pressure is 80 or higher. Take medicines only as told by your health care provider. Follow the directions carefully. Blood pressure medicines must be taken as told by your health care provider. The medicine does not work as well when you skip doses. Skipping doses also puts you at risk for problems. Monitoring Before you monitor your blood pressure:  Do not smoke, drink caffeinated beverages, or exercise within 30 minutes before taking a measurement.  Use the bathroom and empty your bladder (urinate).  Sit quietly for at least 5 minutes before taking measurements. Monitor your blood pressure at home as told by your health care provider. To do this:  Sit with your back straight and supported.  Place your feet flat on the floor. Do not cross your legs.  Support your arm on a flat surface, such as a table. Make sure your upper arm is at heart level.  Each time you measure, take two or three readings one minute apart and record the results. You may also need to have your  blood pressure checked regularly by your health care provider.   General information  Talk with your health care provider about your diet, exercise habits, and other lifestyle factors that may be contributing to hypertension.  Review all the medicines you take with your health care provider because there may be side effects or interactions.  Keep all visits as told by your health care provider. Your health care provider can help you create and adjust your plan for managing your high blood pressure. Where to find more information  National Heart, Lung, and Blood Institute: www.nhlbi.nih.gov  American Heart Association: www.heart.org Contact a health care provider if:  You think you are having a reaction to medicines you have taken.  You have repeated (recurrent) headaches.  You feel dizzy.  You have swelling in your ankles.  You have trouble with your vision. Get help right away if:  You develop a severe headache or confusion.  You have unusual weakness or numbness, or you feel faint.  You have severe pain in your chest or abdomen.  You vomit repeatedly.  You have trouble breathing. These symptoms may represent a serious problem that is an emergency. Do not wait   to see if the symptoms will go away. Get medical help right away. Call your local emergency services (911 in the [place] Do not drive yourself to the hospital. Summary  Hypertension is when the force of blood pumping through your arteries is too strong. If this condition is not controlled, it may put you at risk for serious complications.  Your personal target blood pressure may vary depending on your medical conditions, your age, and other factors. For most people, a normal blood pressure is less than 120/80.  Hypertension is managed by lifestyle changes, medicines, or both.  Lifestyle changes to help manage hypertension include losing weight, eating a healthy, low-sodium diet, exercising more, stopping smoking, and  limiting alcohol. This information is not intended to replace advice given to you by your health care provider. Make sure you discuss any questions you have with your health care provider. Document Revised: 08/14/2019 Document Reviewed: 06/09/2019 Elsevier Patient Education  2021 Elsevier Inc.  

## 2020-08-24 ENCOUNTER — Other Ambulatory Visit (INDEPENDENT_AMBULATORY_CARE_PROVIDER_SITE_OTHER): Payer: 59

## 2020-08-24 DIAGNOSIS — E782 Mixed hyperlipidemia: Secondary | ICD-10-CM

## 2020-08-24 DIAGNOSIS — R030 Elevated blood-pressure reading, without diagnosis of hypertension: Secondary | ICD-10-CM

## 2020-08-25 ENCOUNTER — Other Ambulatory Visit (INDEPENDENT_AMBULATORY_CARE_PROVIDER_SITE_OTHER): Payer: Self-pay | Admitting: Primary Care

## 2020-08-25 ENCOUNTER — Encounter (INDEPENDENT_AMBULATORY_CARE_PROVIDER_SITE_OTHER): Payer: Self-pay | Admitting: Primary Care

## 2020-08-25 DIAGNOSIS — E782 Mixed hyperlipidemia: Secondary | ICD-10-CM

## 2020-08-25 LAB — CBC WITH DIFFERENTIAL/PLATELET
Basophils Absolute: 0 10*3/uL (ref 0.0–0.2)
Basos: 1 %
EOS (ABSOLUTE): 0.7 10*3/uL — ABNORMAL HIGH (ref 0.0–0.4)
Eos: 10 %
Hematocrit: 48 % (ref 37.5–51.0)
Hemoglobin: 16.2 g/dL (ref 13.0–17.7)
Immature Grans (Abs): 0 10*3/uL (ref 0.0–0.1)
Immature Granulocytes: 0 %
Lymphocytes Absolute: 2.5 10*3/uL (ref 0.7–3.1)
Lymphs: 39 %
MCH: 30.1 pg (ref 26.6–33.0)
MCHC: 33.8 g/dL (ref 31.5–35.7)
MCV: 89 fL (ref 79–97)
Monocytes Absolute: 0.5 10*3/uL (ref 0.1–0.9)
Monocytes: 8 %
Neutrophils Absolute: 2.6 10*3/uL (ref 1.4–7.0)
Neutrophils: 42 %
Platelets: 291 10*3/uL (ref 150–450)
RBC: 5.39 x10E6/uL (ref 4.14–5.80)
RDW: 13.1 % (ref 11.6–15.4)
WBC: 6.3 10*3/uL (ref 3.4–10.8)

## 2020-08-25 LAB — CMP14+EGFR
ALT: 61 IU/L — ABNORMAL HIGH (ref 0–44)
AST: 37 IU/L (ref 0–40)
Albumin/Globulin Ratio: 1.7 (ref 1.2–2.2)
Albumin: 4.5 g/dL (ref 4.1–5.2)
Alkaline Phosphatase: 71 IU/L (ref 44–121)
BUN/Creatinine Ratio: 9 (ref 9–20)
BUN: 8 mg/dL (ref 6–20)
Bilirubin Total: 0.4 mg/dL (ref 0.0–1.2)
CO2: 21 mmol/L (ref 20–29)
Calcium: 9.3 mg/dL (ref 8.7–10.2)
Chloride: 102 mmol/L (ref 96–106)
Creatinine, Ser: 0.85 mg/dL (ref 0.76–1.27)
GFR calc Af Amer: 141 mL/min/{1.73_m2} (ref >59)
GFR calc non Af Amer: 122 mL/min/{1.73_m2} (ref >59)
Globulin, Total: 2.7 g/dL (ref 1.5–4.5)
Glucose: 87 mg/dL (ref 65–99)
Potassium: 4.2 mmol/L (ref 3.5–5.2)
Sodium: 138 mmol/L (ref 134–144)
Total Protein: 7.2 g/dL (ref 6.0–8.5)

## 2020-08-25 LAB — LIPID PANEL
Chol/HDL Ratio: 6.7 ratio — ABNORMAL HIGH (ref 0.0–5.0)
Cholesterol, Total: 267 mg/dL — ABNORMAL HIGH (ref 100–199)
HDL: 40 mg/dL (ref >39)
LDL Chol Calc (NIH): 190 mg/dL — ABNORMAL HIGH (ref 0–99)
Triglycerides: 195 mg/dL — ABNORMAL HIGH (ref 0–149)
VLDL Cholesterol Cal: 37 mg/dL (ref 5–40)

## 2020-08-25 MED ORDER — ATORVASTATIN CALCIUM 80 MG PO TABS: 80.0000 mg | ORAL_TABLET | Freq: Every day | ORAL | 3 refills | Status: DC

## 2020-10-20 ENCOUNTER — Encounter: Payer: Self-pay | Admitting: Internal Medicine

## 2020-10-31 NOTE — Progress Notes (Signed)
Virtual Visit via Telephone Note  I connected with Jonathan Kaufman, on 11/02/2020 at 1:53 PM by telephone due to the COVID-19 pandemic and verified that I am speaking with the correct person using two identifiers.  Due to current restrictions/limitations of in-office visits due to the COVID-19 pandemic, this scheduled clinical appointment was converted to a telehealth visit.   Consent: I discussed the limitations, risks, security and privacy concerns of performing an evaluation and management service by telephone and the availability of in person appointments. I also discussed with the patient that there may be a patient responsible charge related to this service. The patient expressed understanding and agreed to proceed.  Location of Patient: Home  Location of Provider: [facility] at [facility]  Persons participating in Telemedicine visit: Covey S Sumpter Deion Swift Zonia Kief, NP Margorie John, CMA  History of Present Illness: Jonathan Kaufman is a 25 year-old male with history of high risk sexual behavior, depression, situational anxiety, mixed hyperlipidemia, elevated ALT measurement, low testosterone level, and fatigue.   Current issues and/or concerns: Reports a couple months ago established care with a primary provider. Reports he felt as if he was being labeled related to his size/weight stating he was questioned about the possibility of having high blood pressure or diabetes and he has neither. Reports his BMI was also a topic of concern. Reports he does not want to be looked at as just a number on the scale. Endorses that cholesterol medication, Atorvastatin, was initiated and he does not completely understand why. However, states that he is taking the medication as prescribed. Making lifestyle changes such as but not limited to diet and exercise.   Requesting testosterone screening at next visit. Family history (patient's brother) of low testosterone and possibility of related  to weight gain.   Past Medical History:  Diagnosis Date  . Allergy    Phreesia 11/02/2020  . Depression    Phreesia 11/02/2020  . Elevated ALT measurement 11/28/2018  . Mixed hyperlipidemia 11/26/2018   Allergies  Allergen Reactions  . Banana   . Other     Cats and bunnies    Current Outpatient Medications on File Prior to Visit  Medication Sig Dispense Refill  . atorvastatin (LIPITOR) 80 MG tablet Take 1 tablet (80 mg total) by mouth daily. 90 tablet 3   No current facility-administered medications on file prior to visit.    Observations/Objective: Alert and oriented x 3. Not in acute distress. Physical examination not completed as this is a telemedicine visit.  Assessment and Plan: 1. Encounter to establish care: - Patient presents today to establish care.  - Return for annual physical examination, labs, and health maintenance. Arrive fasting meaning having no for at least 8 hours prior to appointment. You may have only water or black coffee. Please take scheduled medications as normal.  2. Hyperlipidemia, unspecified hyperlipidemia type: -Practice low-fat heart healthy diet and at least 150 minutes of moderate intensity exercise weekly as tolerated.  - Decrease Atorvastatin from 80 mg daily to 40 mg daily.  - Last lipid panel obtained 08/24/2020. Will repeat at next visit. - Follow-up with primary provider as scheduled. - atorvastatin (LIPITOR) 40 MG tablet; Take 1 tablet (40 mg total) by mouth daily.  Dispense: 30 tablet; Refill: 0  Follow Up Instructions: Return for annual physical exam.   Patient was given clear instructions to go to Emergency Department or return to medical center if symptoms don't improve, worsen, or new problems develop.The patient verbalized  understanding.  I discussed the assessment and treatment plan with the patient. The patient was provided an opportunity to ask questions and all were answered. The patient agreed with the plan and demonstrated an  understanding of the instructions.   The patient was advised to call back or seek an in-person evaluation if the symptoms worsen or if the condition fails to improve as anticipated.   I provided 15 minutes total of non-face-to-face time during this encounter including median intraservice time, reviewing previous notes, labs, imaging, medications, management and patient verbalized understanding.    Rema Fendt, NP  [facility] at [facility] [place], [place] [phone] 11/02/2020, 1:53 PM

## 2020-11-02 ENCOUNTER — Other Ambulatory Visit: Payer: Self-pay

## 2020-11-02 ENCOUNTER — Telehealth (INDEPENDENT_AMBULATORY_CARE_PROVIDER_SITE_OTHER): Payer: 59 | Admitting: Family

## 2020-11-02 DIAGNOSIS — Z7689 Persons encountering health services in other specified circumstances: Secondary | ICD-10-CM | POA: Diagnosis not present

## 2020-11-02 DIAGNOSIS — E785 Hyperlipidemia, unspecified: Secondary | ICD-10-CM | POA: Diagnosis not present

## 2020-11-02 MED ORDER — ATORVASTATIN CALCIUM 40 MG PO TABS: 40.0000 mg | ORAL_TABLET | Freq: Every day | ORAL | 0 refills | Status: DC

## 2020-11-02 NOTE — Progress Notes (Signed)
Establish care

## 2020-11-03 ENCOUNTER — Ambulatory Visit: Payer: 59 | Admitting: Internal Medicine

## 2020-12-02 ENCOUNTER — Other Ambulatory Visit: Payer: Self-pay

## 2020-12-02 ENCOUNTER — Ambulatory Visit
Admission: EM | Admit: 2020-12-02 | Discharge: 2020-12-02 | Disposition: A | Discharge status: Home / Self Care | Payer: 59 | Attending: Emergency Medicine | Admitting: Emergency Medicine

## 2020-12-02 DIAGNOSIS — J069 Acute upper respiratory infection, unspecified: Secondary | ICD-10-CM

## 2020-12-02 MED ORDER — BENZONATATE 200 MG PO CAPS: 200.0000 mg | ORAL_CAPSULE | Freq: Three times a day (TID) | ORAL | 0 refills | Status: AC | PRN

## 2020-12-02 MED ORDER — AMOXICILLIN-POT CLAVULANATE 875-125 MG PO TABS: 1.0000 | ORAL_TABLET | Freq: Two times a day (BID) | ORAL | 0 refills | Status: AC

## 2020-12-02 MED ORDER — DM-GUAIFENESIN ER 30-600 MG PO TB12: 1.0000 | ORAL_TABLET | Freq: Two times a day (BID) | ORAL | 0 refills | Status: DC

## 2020-12-02 NOTE — ED Triage Notes (Signed)
Patient presents to Urgent Care with complaints of sore throat, low grade fever Tuesday, nasal congestion, headache, fatigue since Tuesday. Treating discomfort with ibuprofen and cough drops. Pt states negative rapid test weds.

## 2020-12-02 NOTE — Discharge Instructions (Addendum)
COVID test pending Continue daily Claritin, add in Mucinex DM for further relief of cough and congestion Tessalon every 8 hours for cough If not seeing any improvement over the next 3 to 4 days with the above may fill prescription for Augmentin (Monday) to cover sinus infection Drink plenty of fluids Follow-up for any concerns

## 2020-12-02 NOTE — ED Provider Notes (Signed)
[facility]    CSN: [id number] Arrival date & time: 12/02/20  1021      History   Chief Complaint Chief Complaint  Patient presents with  . Sore Throat  . Headache  . Fatigue  . Nasal Congestion    HPI Jonathan Kaufman is a 25 y.o. male presenting today for evaluation of URI symptoms.  Reports associated cough congestion and sore throat over the past 4 days.  Reports associated fatigue and low-grade fevers.  Reports close contacts with similar symptoms, but all of taking at home COVID test which have been negative.  Reports a lot of mucus that is yellow-colored and thicker.  Worsening cough at nighttime.  HPI  Past Medical History:  Diagnosis Date  . Allergy    [employer]reesia 11/02/2020  . Depression    [employer]reesia 11/02/2020  . Elevated ALT measurement 11/28/2018  . Mixed hyperlipidemia 11/26/2018    Patient Active Problem List   Diagnosis Date Noted  . Fatigue 12/17/2019  . 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 Elevated ALT measurement 11/28/2018  . Depression, recurrent (HCC) 11/26/2018  . Situational anxiety 11/26/2018  . Mixed hyperlipidemia 11/26/2018  . Family history of thyroid disease 05/30/2018  . Obesity (BMI 30-39.9) 05/30/2018  . High risk sexual behavior 03/07/2017  . Not immune to hepatitis B virus 03/07/2017    Past Surgical History:  Procedure Laterality Date  . MOUTH SURGERY         Home Medications    Prior to Admission medications   Medication Sig Start Date End Date Taking? Authorizing Provider  amoxicillin-clavulanate (AUGMENTIN) 875-125 MG tablet Take 1 tablet by mouth every 12 (twelve) hours for 7 days. 12/05/20 12/12/20 Yes Rabecka Brendel C, PA-C  benzonatate (TESSALON) 200 MG capsule Take 1 capsule (200 mg total) by mouth 3 (three) times daily as needed for up to 7 days for cough. 12/02/20 12/09/20 Yes Tanza Pellot C, PA-C  dextromethorphan-guaiFENesin (MUCINEX DM) 30-600 MG 12hr tablet Take 1 tablet by mouth 2 (two) times  daily. 12/02/20  Yes Wayne Wicklund C, PA-C  atorvastatin (LIPITOR) 40 MG tablet Take 1 tablet (40 mg total) by mouth daily. 11/02/20 12/02/20  Rema Fendt, NP    Family History Family History  Problem Relation Age of Onset  . Depression Mother   . Bipolar disorder Father   . Schizophrenia Father   . Thyroid disease Paternal Grandmother     Social History Social History   Tobacco Use  . Smoking status: Never Smoker  . Smokeless tobacco: Never Used  Vaping Use  . Vaping Use: Never used  Substance Use Topics  . Alcohol use: Not Currently  . Drug use: No     Allergies   Banana, Eggs-apples-oats [alimentum], and Other   Review of Systems Review of Systems  Constitutional: Positive for fatigue. Negative for activity change, appetite change, chills and fever.  HENT: Positive for congestion, rhinorrhea and sore throat. Negative for ear pain, sinus pressure and trouble swallowing.   Eyes: Negative for discharge and redness.  Respiratory: Positive for cough. Negative for chest tightness and shortness of breath.   Cardiovascular: Negative for chest pain.  Gastrointestinal: Negative for abdominal pain, diarrhea, nausea and vomiting.  Musculoskeletal: Negative for myalgias.  Skin: Negative for rash.  Neurological: Negative for dizziness, light-headedness and headaches.     Physical Exam Triage Vital Signs ED Triage Vitals  Enc Vitals Group     BP 12/02/20 1244 107/70     Pulse Rate 12/02/20  1244 86     Resp --      Temp 12/02/20 1250 97.8 F (36.6 C)     Temp Source 12/02/20 1244 Oral     SpO2 12/02/20 1244 96 %     Weight 12/02/20 1245 (!) 360 lb 10.8 oz (163.6 kg)     Height --      Head Circumference --      Peak Flow --      Pain Score 12/02/20 1244 3     Pain Loc --      Pain Edu? --      Excl. in GC? --    No data found.  Updated Vital Signs BP 107/70 (BP Location: Left Arm)   Pulse 86   Temp 97.8 F (36.6 C) (Oral)   Wt (!) 360 lb 10.8 oz (163.6  kg)   SpO2 96%   BMI 43.90 kg/m   Visual Acuity Right Eye Distance:   Left Eye Distance:   Bilateral Distance:    Right Eye Near:   Left Eye Near:    Bilateral Near:     Physical Exam Vitals and nursing note reviewed.  Constitutional:      Appearance: He is well-developed.     Comments: No acute distress  HENT:     Head: Normocephalic and atraumatic.     Ears:     Comments: Bilateral ears without tenderness to palpation of external auricle, tragus and mastoid, EAC's without erythema or swelling, TM's with good bony landmarks and cone of light. Non erythematous.     Nose: Nose normal.     Mouth/Throat:     Comments: Oral mucosa pink and moist, no tonsillar enlargement or exudate. Posterior pharynx patent and nonerythematous, no uvula deviation or swelling. Normal phonation. Eyes:     Conjunctiva/sclera: Conjunctivae normal.  Cardiovascular:     Rate and Rhythm: Normal rate.  Pulmonary:     Effort: Pulmonary effort is normal. No respiratory distress.     Comments: Breathing comfortably at rest, CTABL, no wheezing, rales or other adventitious sounds auscultated Abdominal:     General: There is no distension.  Musculoskeletal:        General: Normal range of motion.     Cervical back: Neck supple.  Skin:    General: Skin is warm and dry.  Neurological:     Mental Status: He is alert and oriented to person, place, and time.      UC Treatments / Results  Labs (all labs ordered are listed, but only abnormal results are displayed) Labs Reviewed  NOVEL CORONAVIRUS, NAA    EKG   Radiology No results found.  Procedures Procedures (including critical care time)  Medications Ordered in UC Medications - No data to display  Initial Impression / Assessment and Plan / UC Course  I have reviewed the triage vital signs and the nursing notes.  Pertinent labs & imaging results that were available during my care of the patient were reviewed by me and considered in my  medical decision making (see chart for details).     Viral URI with cough-will confirm negative COVID with PCR.  4 days of symptoms, exam reassuring, recommending continued symptomatic and supportive care rest and fluids.  Did provide prescription for Augmentin to fill on Monday if still not having any improvement in symptoms with recommendations provided with an additional 3 to 4 days.  Discussed strict return precautions. Patient verbalized understanding and is agreeable with plan.  Final Clinical Impressions(s) /  UC Diagnoses   Final diagnoses:  Viral URI with cough     Discharge Instructions     COVID test pending Continue daily Claritin, add in Mucinex DM for further relief of cough and congestion Tessalon every 8 hours for cough If not seeing any improvement over the next 3 to 4 days with the above may fill prescription for Augmentin (Monday) to cover sinus infection Drink plenty of fluids Follow-up for any concerns   ED Prescriptions    Medication Sig Dispense Auth. Provider   benzonatate (TESSALON) 200 MG capsule Take 1 capsule (200 mg total) by mouth 3 (three) times daily as needed for up to 7 days for cough. 28 capsule Ijanae Macapagal C, PA-C   dextromethorphan-guaiFENesin (MUCINEX DM) 30-600 MG 12hr tablet Take 1 tablet by mouth 2 (two) times daily. 20 tablet Caree Wolpert C, PA-C   amoxicillin-clavulanate (AUGMENTIN) 875-125 MG tablet Take 1 tablet by mouth every 12 (twelve) hours for 7 days. 14 tablet Jake Goodson, [place] C, PA-C     PDMP not reviewed this encounter.   Lew Dawes, [place] 12/02/20 1341

## 2020-12-03 LAB — SARS-COV-2, NAA 2 DAY TAT

## 2020-12-03 LAB — NOVEL CORONAVIRUS, NAA: SARS-CoV-2, NAA: NOT DETECTED

## 2020-12-04 ENCOUNTER — Other Ambulatory Visit: Payer: Self-pay | Admitting: Family

## 2020-12-04 DIAGNOSIS — E785 Hyperlipidemia, unspecified: Secondary | ICD-10-CM

## 2020-12-06 NOTE — Progress Notes (Signed)
Patient ID: Jonathan Kaufman, male    DOB: 04/27/1996  MRN: [id number]  CC: Annual Physical Exam  Subjective: Jonathan Kaufman is a 25 y.o. male who presents for annual physical exam. His concerns today include: none  Patient Active Problem List   Diagnosis Date Noted  . Fatigue 12/17/2019  . 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 Elevated ALT measurement 11/28/2018  . Depression, recurrent (HCC) 11/26/2018  . Situational anxiety 11/26/2018  . Mixed hyperlipidemia 11/26/2018  . Family history of thyroid disease 05/30/2018  . Obesity (BMI 30-39.9) 05/30/2018  . High risk sexual behavior 03/07/2017  . Not immune to hepatitis B virus 03/07/2017     Current Outpatient Medications on File Prior to Visit  Medication Sig Dispense Refill  . atorvastatin (LIPITOR) 40 MG tablet TAKE 1 TABLET BY MOUTH EVERY DAY 30 tablet 0  . amoxicillin-clavulanate (AUGMENTIN) 875-125 MG tablet Take 1 tablet by mouth every 12 (twelve) hours for 7 days. 14 tablet 0  . benzonatate (TESSALON) 200 MG capsule Take 1 capsule (200 mg total) by mouth 3 (three) times daily as needed for up to 7 days for cough. 28 capsule 0  . dextromethorphan-guaiFENesin (MUCINEX DM) 30-600 MG 12hr tablet Take 1 tablet by mouth 2 (two) times daily. 20 tablet 0   No current facility-administered medications on file prior to visit.    Allergies  Allergen Reactions  . Banana   . Eggs-Apples-Oats [Alimentum]   . Other     Cats and bunnies    Social History   Socioeconomic History  . Marital status: Married    Spouse name: Not on file  . Number of children: Not on file  . Years of education: Not on file  . Highest education level: Not on file  Occupational History  . Not on file  Tobacco Use  . Smoking status: Never Smoker  . Smokeless tobacco: Never Used  Vaping Use  . Vaping Use: Never used  Substance and Sexual Activity  . Alcohol use: Not Currently  . Drug use: No  . Sexual activity: Not on file  Other Topics  Concern  . Not on file  Social History Narrative  . Not on file   Social Determinants of Health   Financial Resource Strain: Not on file  Food Insecurity: Not on file  Transportation Needs: Not on file  Physical Activity: Not on file  Stress: Not on file  Social Connections: Not on file  Intimate Partner Violence: Not on file    Family History  Problem Relation Age of Onset  . Depression Mother   . Bipolar disorder Father   . Schizophrenia Father   . Thyroid disease Paternal Grandmother     Past Surgical History:  Procedure Laterality Date  . MOUTH SURGERY      ROS: Review of Systems Negative except as stated above  PHYSICAL EXAM: BP 126/85 (BP Location: Left Arm, Patient Position: Sitting, Cuff Size: Large)   Pulse 87   Temp 98.2 F (36.8 C)   Resp 15   Ht 6' 3.16" (1.909 m)   Wt (!) 343 lb 4.8 oz (155.7 kg)   SpO2 96%   BMI 42.73 kg/m   Physical Exam HENT:     Head: Normocephalic and atraumatic.     Right Ear: Tympanic membrane, ear canal and external ear normal.     Left Ear: Tympanic membrane, ear canal and external ear normal.  Eyes:     Extraocular Movements: Extraocular movements intact.  Conjunctiva/sclera: Conjunctivae normal.     Pupils: Pupils are equal, round, and reactive to light.  Cardiovascular:     Rate and Rhythm: Normal rate and regular rhythm.     Pulses: Normal pulses.     Heart sounds: Normal heart sounds.  Pulmonary:     Effort: Pulmonary effort is normal.     Breath sounds: Normal breath sounds.  Abdominal:     Palpations: Abdomen is soft.  Genitourinary:    Comments: Patient declined examination.  Musculoskeletal:        General: Normal range of motion.     Cervical back: Normal range of motion and neck supple.  Skin:    General: Skin is warm and dry.     Capillary Refill: Capillary refill takes less than 2 seconds.     Comments: Patient reports a mole removed from his back during childhood. Has not since returned.   Neurological:     General: No focal deficit present.     Mental Status: He is alert and oriented to person, place, and time.  Psychiatric:        Mood and Affect: Mood normal.        Behavior: Behavior normal.    ASSESSMENT AND PLAN: 1. Annual physical exam:  - Counseled on 150 minutes of exercise per week as tolerated, healthy eating (including decreased daily intake of saturated fats, cholesterol, added sugars, sodium), STI prevention, and routine healthcare maintenance.  2. Screening for metabolic disorder: - CMP last obtained 08/24/2020. Kidney function normal at that time. Liver function elevated at that time.  - Hepatic Function Panel to recheck liver function. - Hepatic Function Panel  3. Screening for deficiency anemia: - CBC last obtained 08/24/2020 and normal at that time.  4. Diabetes mellitus screening: - Hemoglobin A1c to screen for pre-diabetes/diabetes. - Hemoglobin A1c  5. Screening cholesterol level: - Lipid panel to screen for high cholesterol.  - Lipid panel  6. Thyroid disorder screen: - TSH to check thyroid function.  - TSH+T4F+T3Free  7. Low serum testosterone level: - On 11/02/2020 patient requested testosterone screening at next visit.  - Testosterone to screen for deficiency. - Testosterone     Patient was given the opportunity to ask questions.  Patient verbalized understanding of the plan and was able to repeat key elements of the plan. Patient was given clear instructions to go to Emergency Department or return to medical center if symptoms don't improve, worsen, or new problems develop.The patient verbalized understanding.   Orders Placed This Encounter  Procedures  . TSH+T4F+T3Free  . Hemoglobin A1c  . Lipid panel  . Testosterone     Requested Prescriptions    No prescriptions requested or ordered in this encounter   Follow-up with primary provider as scheduled.   Rema Fendt, NP

## 2020-12-07 ENCOUNTER — Ambulatory Visit (INDEPENDENT_AMBULATORY_CARE_PROVIDER_SITE_OTHER): Payer: PRIVATE HEALTH INSURANCE | Admitting: Family

## 2020-12-07 ENCOUNTER — Encounter: Payer: Self-pay | Admitting: Family

## 2020-12-07 ENCOUNTER — Other Ambulatory Visit: Payer: Self-pay

## 2020-12-07 VITALS — BP 126/85 | HR 87 | Temp 98.2°F | Resp 15 | Ht 75.16 in | Wt 343.3 lb

## 2020-12-07 DIAGNOSIS — R7989 Other specified abnormal findings of blood chemistry: Secondary | ICD-10-CM | POA: Diagnosis not present

## 2020-12-07 DIAGNOSIS — Z130 Encounter for screening for diseases of the blood and blood-forming organs and certain disorders involving the immune mechanism: Secondary | ICD-10-CM

## 2020-12-07 DIAGNOSIS — Z1329 Encounter for screening for other suspected endocrine disorder: Secondary | ICD-10-CM

## 2020-12-07 DIAGNOSIS — Z13220 Encounter for screening for lipoid disorders: Secondary | ICD-10-CM

## 2020-12-07 DIAGNOSIS — Z0000 Encounter for general adult medical examination without abnormal findings: Secondary | ICD-10-CM

## 2020-12-07 DIAGNOSIS — Z13228 Encounter for screening for other metabolic disorders: Secondary | ICD-10-CM

## 2020-12-07 DIAGNOSIS — Z131 Encounter for screening for diabetes mellitus: Secondary | ICD-10-CM

## 2020-12-07 NOTE — Progress Notes (Signed)
Physical

## 2020-12-07 NOTE — Patient Instructions (Signed)
 Annual physical exam and labs today. Preventive Care 25-25 Years Old, Male Preventive care refers to lifestyle choices and visits with your health care provider that can promote health and wellness. This includes:  A yearly physical exam. This is also called an annual wellness visit.  Regular dental and eye exams.  Immunizations.  Screening for certain conditions.  Healthy lifestyle choices, such as: ? Eating a healthy diet. ? Getting regular exercise. ? Not using drugs or products that contain nicotine and tobacco. ? Limiting alcohol use. What can I expect for my preventive care visit? Physical exam Your health care provider may check your:  Height and weight. These may be used to calculate your BMI (body mass index). BMI is a measurement that tells if you are at a healthy weight.  Heart rate and blood pressure.  Body temperature.  Skin for abnormal spots. Counseling Your health care provider may ask you questions about your:  Past medical problems.  Family's medical history.  Alcohol, tobacco, and drug use.  Emotional well-being.  Home life and relationship well-being.  Sexual activity.  Diet, exercise, and sleep habits.  Work and work environment.  Access to firearms. What immunizations do I need? Vaccines are usually given at various ages, according to a schedule. Your health care provider will recommend vaccines for you based on your age, medical history, and lifestyle or other factors, such as travel or where you work.   What tests do I need? Blood tests  Lipid and cholesterol levels. These may be checked every 5 years starting at age 20.  Hepatitis C test.  Hepatitis B test. Screening  Diabetes screening. This is done by checking your blood sugar (glucose) after you have not eaten for a while (fasting).  Genital exam to check for testicular cancer or hernias.  STD (sexually transmitted disease) testing, if you are at risk. Talk with your  health care provider about your test results, treatment options, and if necessary, the need for more tests.   Follow these instructions at home: Eating and drinking  Eat a healthy diet that includes fresh fruits and vegetables, whole grains, lean protein, and low-fat dairy products.  Drink enough fluid to keep your urine pale yellow.  Take vitamin and mineral supplements as recommended by your health care provider.  Do not drink alcohol if your health care provider tells you not to drink.  If you drink alcohol: ? Limit how much you have to 0-2 drinks a day. ? Be aware of how much alcohol is in your drink. In the [place] one drink equals one 12 oz bottle of beer (355 mL), one 5 oz glass of wine (148 mL), or one 1 oz glass of hard liquor (44 mL).   Lifestyle  Take daily care of your teeth and gums. Brush your teeth every morning and night with fluoride toothpaste. Floss one time each day.  Stay active. Exercise for at least 30 minutes 5 or more days each week.  Do not use any products that contain nicotine or tobacco, such as cigarettes, e-cigarettes, and chewing tobacco. If you need help quitting, ask your health care provider.  Do not use drugs.  If you are sexually active, practice safe sex. Use a condom or other form of protection to prevent STIs (sexually transmitted infections).  Find healthy ways to cope with stress, such as: ? Meditation, yoga, or listening to music. ? Journaling. ? Talking to a trusted person. ? Spending time with friends and family. Safety    Safety  Always wear your seat belt while driving or riding in a vehicle.  Do not drive: ? If you have been drinking alcohol. Do not ride with someone who has been drinking. ? When you are tired or distracted. ? While texting.  Wear a helmet and other protective equipment during sports activities.  If you have firearms in your house, make sure you follow all gun safety procedures.  Seek help if you have been physically  or sexually abused. What's next?  Go to your health care provider once a year for an annual wellness visit.  Ask your health care provider how often you should have your eyes and teeth checked.  Stay up to date on all vaccines. This information is not intended to replace advice given to you by your health care provider. Make sure you discuss any questions you have with your health care provider. Document Revised: 03/25/2019 Document Reviewed: 07/03/2018 Elsevier Patient Education  2021 [employer].

## 2020-12-08 ENCOUNTER — Other Ambulatory Visit: Payer: Self-pay | Admitting: Family

## 2020-12-08 DIAGNOSIS — R7401 Elevation of levels of liver transaminase levels: Secondary | ICD-10-CM

## 2020-12-08 LAB — TSH+T4F+T3FREE
Free T4: 1.15 ng/dL (ref 0.82–1.77)
T3, Free: 4 pg/mL (ref 2.0–4.4)
TSH: 1.55 u[IU]/mL (ref 0.450–4.500)

## 2020-12-08 LAB — HEPATIC FUNCTION PANEL
ALT: 57 IU/L — ABNORMAL HIGH (ref 0–44)
AST: 28 IU/L (ref 0–40)
Albumin: 4.6 g/dL (ref 4.1–5.2)
Alkaline Phosphatase: 98 IU/L (ref 44–121)
Bilirubin Total: 0.3 mg/dL (ref 0.0–1.2)
Bilirubin, Direct: <0.1 mg/dL (ref 0.00–0.40)
Total Protein: 7.3 g/dL (ref 6.0–8.5)

## 2020-12-08 LAB — TESTOSTERONE: Testosterone: 339 ng/dL (ref 264–916)

## 2020-12-08 LAB — LIPID PANEL
Chol/HDL Ratio: 3.9 ratio (ref 0.0–5.0)
Cholesterol, Total: 165 mg/dL (ref 100–199)
HDL: 42 mg/dL (ref >39)
LDL Chol Calc (NIH): 97 mg/dL (ref 0–99)
Triglycerides: 147 mg/dL (ref 0–149)
VLDL Cholesterol Cal: 26 mg/dL (ref 5–40)

## 2020-12-08 LAB — HEMOGLOBIN A1C
Est. average glucose Bld gHb Est-mCnc: 114 mg/dL
Hgb A1c MFr Bld: 5.6 % (ref 4.8–5.6)

## 2020-12-08 NOTE — Progress Notes (Signed)
Update from previous result note: Referral placed to Hepatologist.

## 2020-12-08 NOTE — Progress Notes (Signed)
Thyroid function normal.   Testosterone normal.  No diabetes.   Cholesterol normal. We can continue Atorvastatin 40 mg daily for preventive measures. We can discuss more at our next visit.  ALT elevated. This enzyme is used to check liver function.   Some causes of an elevation of ALT may be increased alcohol consumption or overuse of NSAID's such as Ibuprofen, Aleve, Motrin, and Naproxen. If any of these apply please consider limiting use.   Since liver enzyme remaining high since 3 months ago will place referral to Nephrology for further evaluation and management. Their office should call you within 2 weeks with appointment details.

## 2020-12-27 ENCOUNTER — Other Ambulatory Visit: Payer: Self-pay | Admitting: Family

## 2020-12-27 ENCOUNTER — Encounter: Payer: Self-pay | Admitting: Family

## 2020-12-27 DIAGNOSIS — Z1341 Encounter for autism screening: Secondary | ICD-10-CM

## 2020-12-27 NOTE — Telephone Encounter (Signed)
A referral placed to Psychology.   Address: [facility] [address], [place] [address]  Phone Number: [phone]  Please note their office should call patient within 2 weeks with appointment details. If they do not please let primary provider know.

## 2021-01-18 ENCOUNTER — Other Ambulatory Visit: Payer: Self-pay | Admitting: Nurse Practitioner

## 2021-01-18 DIAGNOSIS — R7401 Elevation of levels of liver transaminase levels: Secondary | ICD-10-CM

## 2021-02-13 ENCOUNTER — Ambulatory Visit
Admission: RE | Admit: 2021-02-13 | Discharge: 2021-02-13 | Disposition: A | Discharge status: Home / Self Care | Payer: PRIVATE HEALTH INSURANCE | Source: Ambulatory Visit | Attending: Nurse Practitioner | Admitting: Nurse Practitioner

## 2021-02-13 DIAGNOSIS — R7401 Elevation of levels of liver transaminase levels: Secondary | ICD-10-CM

## 2021-02-20 ENCOUNTER — Ambulatory Visit (INDEPENDENT_AMBULATORY_CARE_PROVIDER_SITE_OTHER): Payer: 59 | Admitting: Primary Care

## 2021-03-28 ENCOUNTER — Ambulatory Visit (INDEPENDENT_AMBULATORY_CARE_PROVIDER_SITE_OTHER): Payer: PRIVATE HEALTH INSURANCE | Admitting: Psychology

## 2021-03-28 DIAGNOSIS — F419 Anxiety disorder, unspecified: Secondary | ICD-10-CM

## 2021-03-28 DIAGNOSIS — F341 Dysthymic disorder: Secondary | ICD-10-CM | POA: Diagnosis not present

## 2021-04-18 ENCOUNTER — Ambulatory Visit: Payer: PRIVATE HEALTH INSURANCE | Admitting: Psychology

## 2021-04-24 DIAGNOSIS — K760 Fatty (change of) liver, not elsewhere classified: Secondary | ICD-10-CM | POA: Insufficient documentation

## 2021-05-11 ENCOUNTER — Ambulatory Visit: Payer: PRIVATE HEALTH INSURANCE | Admitting: Psychology

## 2021-05-15 ENCOUNTER — Other Ambulatory Visit: Payer: Self-pay

## 2021-05-15 ENCOUNTER — Ambulatory Visit: Payer: PRIVATE HEALTH INSURANCE | Admitting: Psychology

## 2021-05-25 ENCOUNTER — Encounter: Payer: Self-pay | Admitting: Nurse Practitioner

## 2021-05-25 ENCOUNTER — Ambulatory Visit (INDEPENDENT_AMBULATORY_CARE_PROVIDER_SITE_OTHER): Payer: 59 | Admitting: Nurse Practitioner

## 2021-05-25 ENCOUNTER — Ambulatory Visit: Payer: PRIVATE HEALTH INSURANCE

## 2021-05-25 ENCOUNTER — Other Ambulatory Visit: Payer: Self-pay

## 2021-05-25 DIAGNOSIS — R509 Fever, unspecified: Secondary | ICD-10-CM | POA: Diagnosis not present

## 2021-05-25 DIAGNOSIS — R6889 Other general symptoms and signs: Secondary | ICD-10-CM | POA: Insufficient documentation

## 2021-05-25 NOTE — Progress Notes (Signed)
Subjective:     Jonathan Kaufman is a 25 y.o. male who presents for evaluation of symptoms of a URI. Symptoms include achiness, congestion, facial pain, headache described as dull, low grade fever, nasal congestion, post nasal drip, productive cough with  yellow colored sputum, and purulent nasal discharge. Onset of symptoms was 1 days ago, and has been gradually worsening since that time. Treatment to date: decongestants.    Review of Systems Review of Systems  Constitutional:  Positive for chills, fever and malaise/fatigue.  HENT:  Positive for congestion and sinus pain.   Respiratory:  Positive for cough and sputum production.   Cardiovascular: Negative.     Objective:    Physical Exam Constitutional:      General: He is not in acute distress. HENT:     Nose: Nasal tenderness, congestion and rhinorrhea present.  Cardiovascular:     Rate and Rhythm: Normal rate and regular rhythm.  Pulmonary:     Effort: Pulmonary effort is normal.     Breath sounds: Normal breath sounds.  Musculoskeletal:     Right lower leg: No edema.     Left lower leg: No edema.  Skin:    General: Skin is warm and dry.  Neurological:     Mental Status: He is alert and oriented to person, place, and time.  Psychiatric:        Mood and Affect: Affect normal.     Assessment:    viral upper respiratory illness   Plan:    Discussed diagnosis and treatment of URI. Suggested symptomatic OTC remedies. Nasal saline spray for congestion. Follow up as needed. Respiratory panel ordered

## 2021-05-25 NOTE — Patient Instructions (Signed)
URI symptoms Covid 19 Cough:   Stay well hydrated  Stay active  Deep breathing exercises  May take tylenol or fever or pain  May take mucinex twice daily    Follow up:  Follow up if needed

## 2021-05-26 LAB — COVID-19, FLU A+B AND RSV
Influenza A, NAA: NOT DETECTED
Influenza B, NAA: NOT DETECTED
RSV, NAA: NOT DETECTED
SARS-CoV-2, NAA: NOT DETECTED

## 2021-06-01 ENCOUNTER — Ambulatory Visit (INDEPENDENT_AMBULATORY_CARE_PROVIDER_SITE_OTHER): Payer: PRIVATE HEALTH INSURANCE | Admitting: Psychology

## 2021-06-01 DIAGNOSIS — F4389 Other reactions to severe stress: Secondary | ICD-10-CM | POA: Diagnosis not present

## 2021-06-01 DIAGNOSIS — F411 Generalized anxiety disorder: Secondary | ICD-10-CM | POA: Diagnosis not present

## 2021-06-01 DIAGNOSIS — F802 Mixed receptive-expressive language disorder: Secondary | ICD-10-CM | POA: Diagnosis not present

## 2021-06-19 ENCOUNTER — Ambulatory Visit: Payer: PRIVATE HEALTH INSURANCE | Admitting: Psychology

## 2021-08-07 DIAGNOSIS — F4310 Post-traumatic stress disorder, unspecified: Secondary | ICD-10-CM | POA: Diagnosis not present

## 2021-08-07 DIAGNOSIS — F329 Major depressive disorder, single episode, unspecified: Secondary | ICD-10-CM | POA: Diagnosis not present

## 2021-08-07 DIAGNOSIS — F411 Generalized anxiety disorder: Secondary | ICD-10-CM | POA: Diagnosis not present

## 2021-08-08 DIAGNOSIS — F411 Generalized anxiety disorder: Secondary | ICD-10-CM | POA: Diagnosis not present

## 2021-08-09 DIAGNOSIS — Z713 Dietary counseling and surveillance: Secondary | ICD-10-CM | POA: Diagnosis not present

## 2021-08-17 DIAGNOSIS — K011 Impacted teeth: Secondary | ICD-10-CM | POA: Diagnosis not present

## 2021-08-23 DIAGNOSIS — Z713 Dietary counseling and surveillance: Secondary | ICD-10-CM | POA: Diagnosis not present

## 2021-10-02 IMAGING — US US ABDOMEN COMPLETE
1 series · 14 of 25 positions shown · non-contrast
Comparison: None.

CLINICAL DATA: Initial evaluation for elevated ALT.

EXAM:
ABDOMEN ULTRASOUND COMPLETE

[Series 1: us abdomen complete · 0.33mm/px · 14 of 81 slices shown]
[im 1/81]
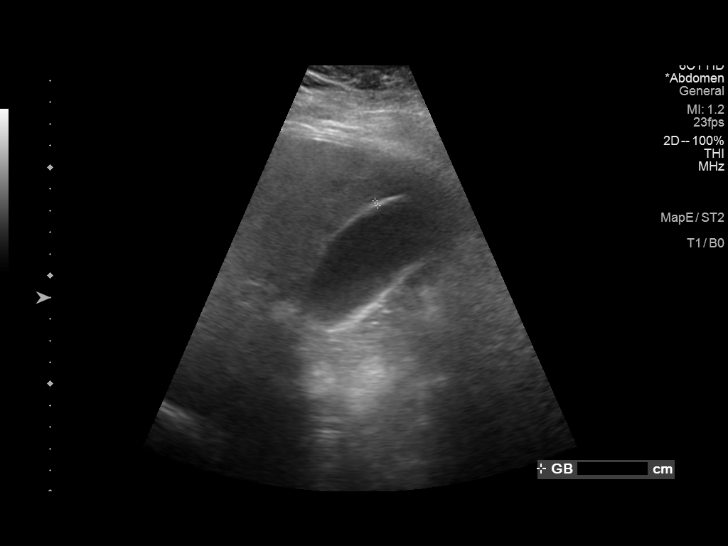
[im 7/81]
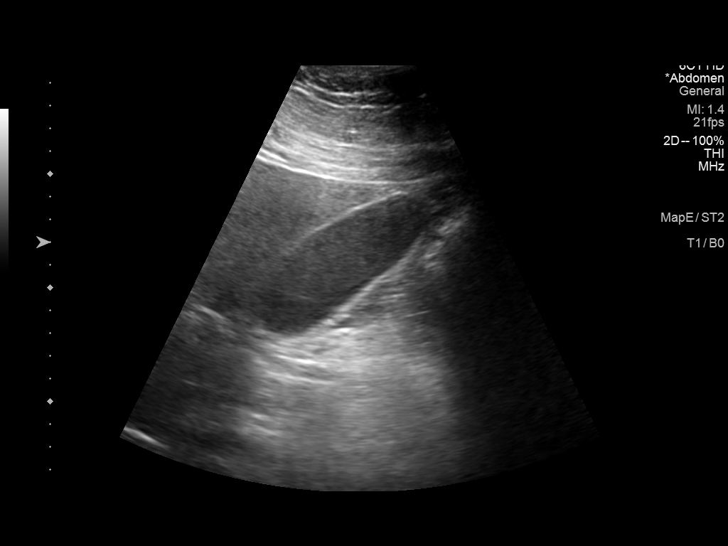
[im 14/81]
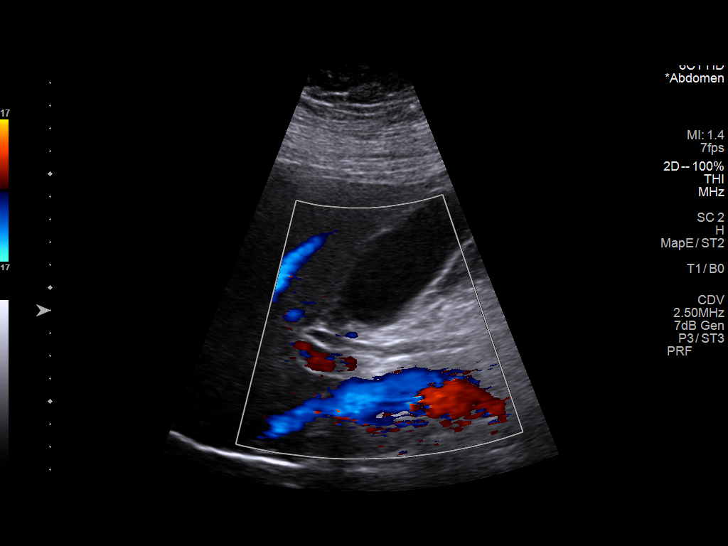
[im 21/81]
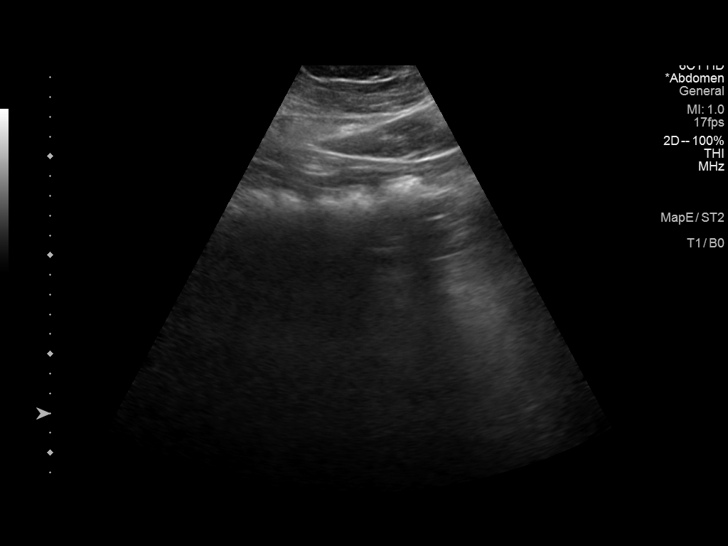
[im 27/81]
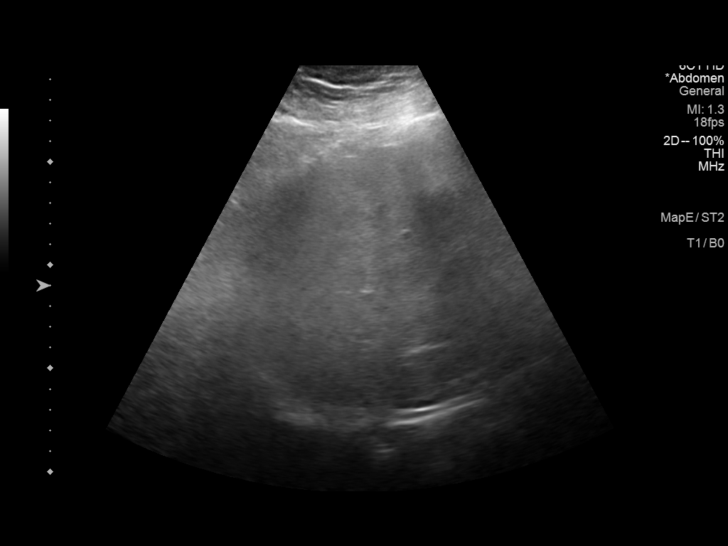
[im 31/81]
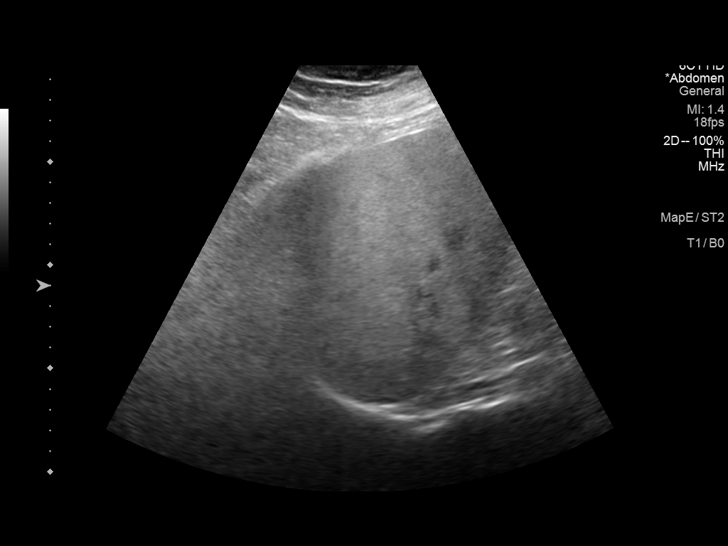
[im 37/81]
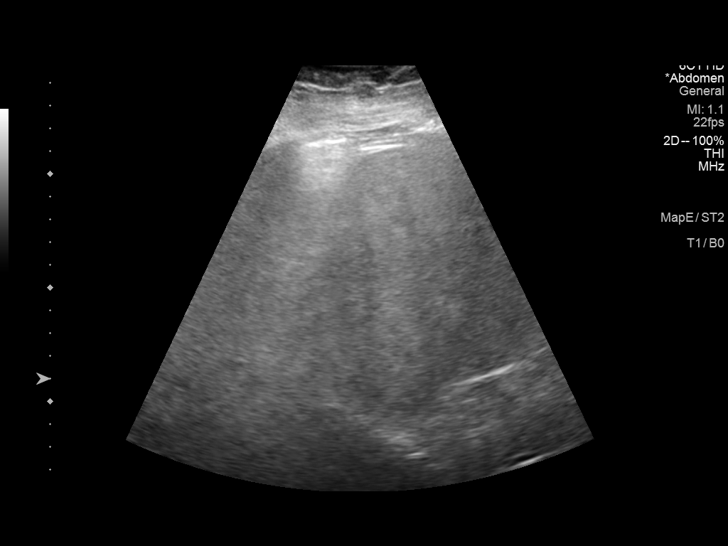
[im 44/81]
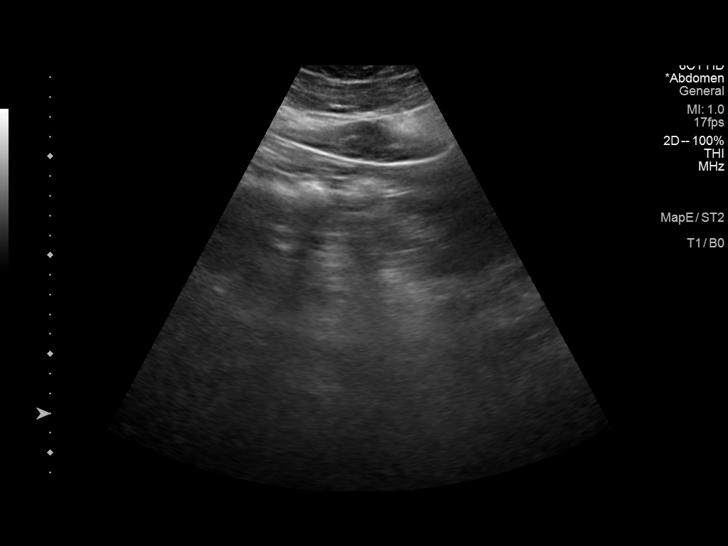
[im 51/81]
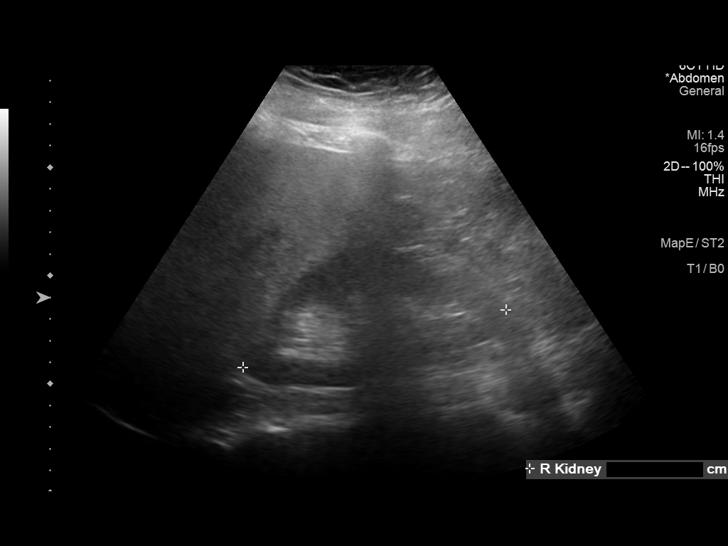
[im 54/81]
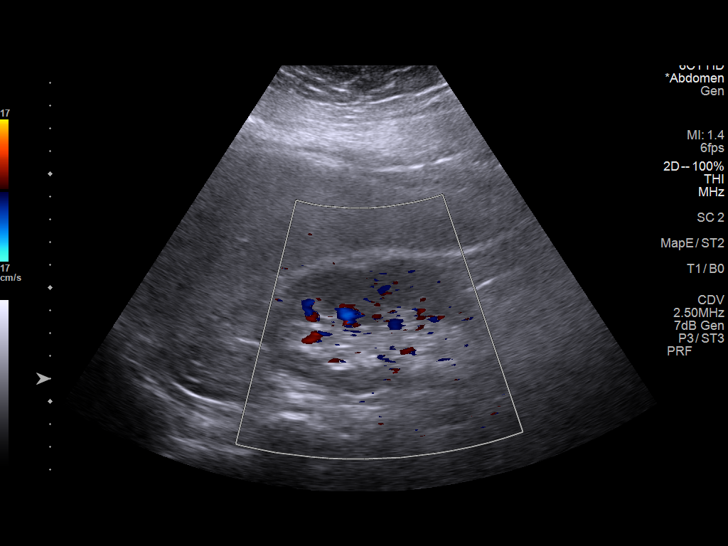
[im 61/81]
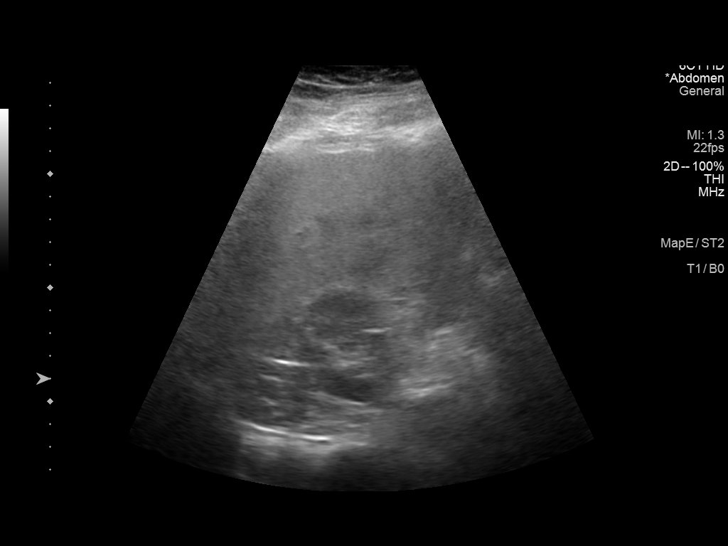
[im 67/81]
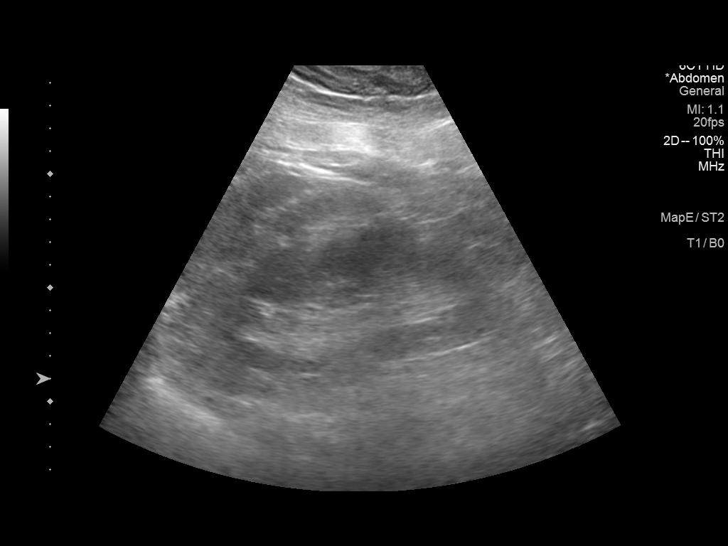
[im 74/81]
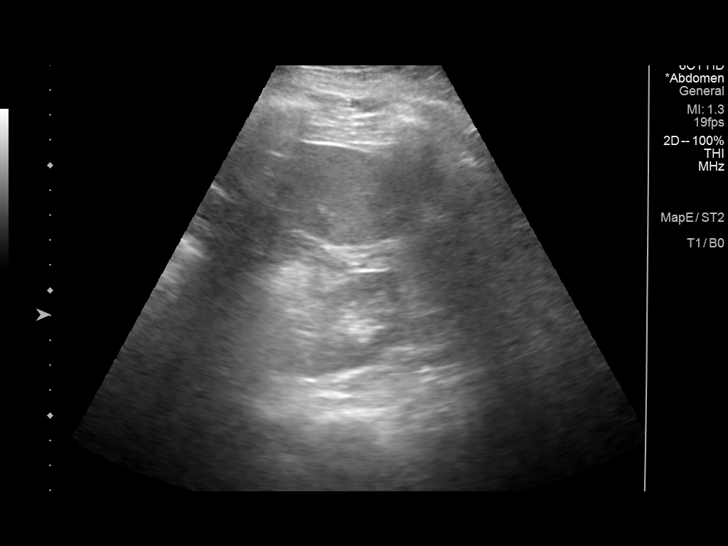
[im 81/81]
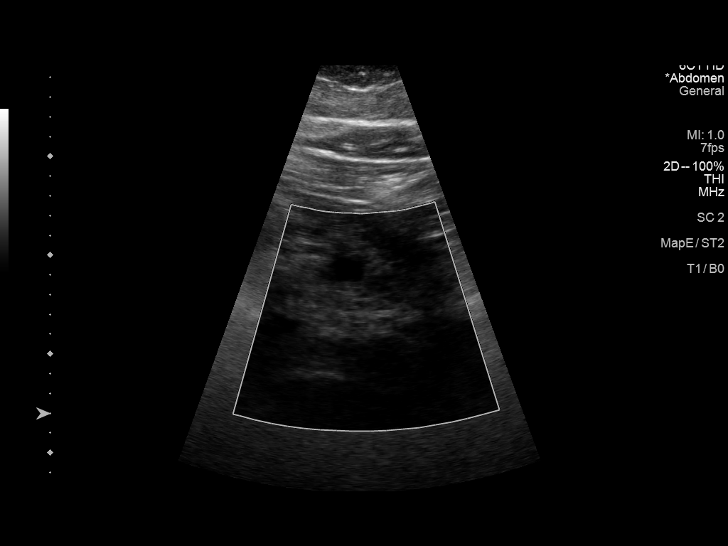

[14 of 25 positions shown; findings below may reference images not displayed]

FINDINGS: Gallbladder: No gallstones or wall thickening visualized. No
sonographic Murphy sign noted by sonographer.

Common bile duct: Diameter: 4.5 mm

Liver: No focal lesion identified. Liver demonstrates a coarse
echogenic echotexture. Portal vein is patent on color Doppler
imaging with normal direction of blood flow towards the liver.

IVC: Not visualized secondary to shadowing from overlying bowel gas.

Pancreas: Visualized portion unremarkable.

Spleen: Size and appearance within normal limits.

Right Kidney: Length: 12.4 cm. Echogenicity within normal limits. No
mass or hydronephrosis visualized.

Left Kidney: Length: 12.8 cm. Echogenicity within normal limits. No
mass or hydronephrosis visualized.

Abdominal aorta: No aneurysm visualized.

Other findings: None.
IMPRESSION: 1. Coarse echogenic echotexture within the hepatic parenchyma,
nonspecific, but most commonly seen with steatosis.
2. Otherwise unremarkable abdominal ultrasound, with normal
sonographic appearance of the gallbladder. No biliary dilatation.

## 2021-11-20 DIAGNOSIS — F4310 Post-traumatic stress disorder, unspecified: Secondary | ICD-10-CM | POA: Diagnosis not present

## 2021-11-20 DIAGNOSIS — F329 Major depressive disorder, single episode, unspecified: Secondary | ICD-10-CM | POA: Diagnosis not present

## 2021-11-20 DIAGNOSIS — F411 Generalized anxiety disorder: Secondary | ICD-10-CM | POA: Diagnosis not present

## 2021-11-28 DIAGNOSIS — B349 Viral infection, unspecified: Secondary | ICD-10-CM | POA: Diagnosis not present

## 2021-12-01 NOTE — Progress Notes (Signed)
Patient ID: Jonathan Kaufman, male    DOB: [birth year]  MRN: [id number]  CC: Annual Physical Exam  Subjective: Jonathan Kaufman is a 26 y.o. male who presents for annual physical exam.   His concerns today include:  Interested in beginning weight loss medication Wegovy. Currently established with an eating disorder therapist for what he recalls is binge eating. Plans to begin seeing a nutritionist soon. Reports through eating disorder therapist was able to determine that his body does not respond to limited eating. Reports when he limits eating he gains weight. Currently eating routine meals when able to do so. He is exercising routinely at least 5 days weekly for 30 minutes each. Reports does not have an exact amount on how many pounds he would like to lose. Reports goal is to get back to his normal weight several years ago. Denies personal and family history of thyroid cancer. His grandmother has been taking thyroid medication since her early 22's unrelated to cancer, she is currently in her 35's.   Established with [facility] for elevated liver function. Reports an ultrasound was completed and told he has beginning stages of fatty liver. Scheduled for annual follow-up June 2023.    Patient Active Problem List   Diagnosis Date Noted   Fever, unspecified 05/25/2021   Flu-like symptoms 05/25/2021   NAFLD (nonalcoholic fatty liver disease) 03/00/9233   Low serum testosterone level 10/18/2019   Elevated ALT measurement 11/28/2018   Mixed hyperlipidemia 11/26/2018   Family history of thyroid disease 05/30/2018   Not immune to hepatitis B virus 03/07/2017   Seasonal allergies 01/20/2015     Current Outpatient Medications on File Prior to Visit  Medication Sig Dispense Refill   DESCOVY 200-25 MG tablet Take 1 tablet by mouth daily.     No current facility-administered medications on file prior to visit.    Allergies  Allergen Reactions   Apple Rash    Itchy throat    Proanthocyanidin Itching   Banana     Itchy throat   Eggs-Apples-Oats [Alimentum]    Other     Cats and bunnies    Social History   Socioeconomic History   Marital status: Married    Spouse name: Not on file   Number of children: Not on file   Years of education: Not on file   Highest education level: Not on file  Occupational History   Not on file  Tobacco Use   Smoking status: Never    Passive exposure: Never   Smokeless tobacco: Never  Vaping Use   Vaping Use: Never used  Substance and Sexual Activity   Alcohol use: Not Currently   Drug use: No   Sexual activity: Not on file  Other Topics Concern   Not on file  Social History Narrative   Not on file   Social Determinants of Health   Financial Resource Strain: Not on file  Food Insecurity: Not on file  Transportation Needs: Not on file  Physical Activity: Not on file  Stress: Not on file  Social Connections: Not on file  Intimate Partner Violence: Not on file    Family History  Problem Relation Age of Onset   Depression Mother    Bipolar disorder Father    Schizophrenia Father    Thyroid disease Paternal Grandmother     Past Surgical History:  Procedure Laterality Date   MOUTH SURGERY      ROS: Review of Systems Negative except as  stated above  PHYSICAL EXAM: BP 130/85 (BP Location: Left Arm, Patient Position: Sitting, Cuff Size: Large)   Pulse 92   Temp 98.3 F (36.8 C)   Resp 18   Ht 6' 3.16" (1.909 m)   Wt (!) 355 lb (161 kg)   SpO2 95%   BMI 44.19 kg/m   Physical Exam HENT:     Head: Normocephalic and atraumatic.     Right Ear: Tympanic membrane, ear canal and external ear normal.     Left Ear: Tympanic membrane, ear canal and external ear normal.     Nose: Nose normal.     Mouth/Throat:     Mouth: Mucous membranes are moist.     Pharynx: Oropharynx is clear.  Eyes:     Extraocular Movements: Extraocular movements intact.     Conjunctiva/sclera: Conjunctivae normal.      Pupils: Pupils are equal, round, and reactive to light.  Cardiovascular:     Rate and Rhythm: Normal rate and regular rhythm.     Pulses: Normal pulses.     Heart sounds: Normal heart sounds.  Pulmonary:     Effort: Pulmonary effort is normal.     Breath sounds: Normal breath sounds.  Abdominal:     General: Bowel sounds are normal.     Palpations: Abdomen is soft.  Genitourinary:    Comments: Patient declined. Musculoskeletal:        General: Normal range of motion.     Cervical back: Normal range of motion and neck supple.  Skin:    General: Skin is warm and dry.     Capillary Refill: Capillary refill takes less than 2 seconds.  Neurological:     General: No focal deficit present.     Mental Status: He is alert and oriented to person, place, and time.  Psychiatric:        Mood and Affect: Mood normal.        Behavior: Behavior normal.    ASSESSMENT AND PLAN: 1. Annual physical exam: - Counseled on 150 minutes of exercise per week as tolerated, healthy eating (including decreased daily intake of saturated fats, cholesterol, added sugars, sodium), STI prevention, and routine healthcare maintenance.  2. Screening for metabolic disorder: - BMP to evaluate kidney function and electrolyte balance. - Basic Metabolic Panel  3. Screening for deficiency anemia: - CBC to screen for anemia. - CBC  4. Diabetes mellitus screening: - Hemoglobin A1c to screen for pre-diabetes/diabetes. - Hemoglobin A1c  5. Thyroid disorder screen: - TSH to check thyroid function.  - TSH  6. Mixed hyperlipidemia: - Update lipid panel. - Lipid panel  7. Encounter for weight management: - Discussed with patient in detail Semaglutide-Weight Management [facility]) medication adherence, adverse effects, monthly weight checks, and weight loss goal of 5% within 12 weeks.  - Patient reports currently established with an eating disorder therapist for what he recalls is binge eating. Reports he plans to  begin seeing a nutritionist soon. Reports through eating disorder therapist was able to determine that his body does not respond to limited eating. Reports when he limits eating he gains weight. Currently eating routine meals when able to do so. Patients reports that while he is interested in beginning Semaglutide-Weight Management [facility]) he will discuss information from today's visit with his eating disorder therapist prior to beginning regimen. Patient reports he will update primary provider of decision soon.  Patient was given the opportunity to ask questions.  Patient verbalized understanding of the plan and was able  to repeat key elements of the plan. Patient was given clear instructions to go to Emergency Department or return to medical center if symptoms don't improve, worsen, or new problems develop.The patient verbalized understanding.   Orders Placed This Encounter  Procedures   CBC   Lipid panel   TSH   Hemoglobin A1c   Basic Metabolic Panel    Return in about 1 year (around 12/08/2022) for Physical per patient preference.  Rema FendtAmy J Niyati Heinke, NP

## 2021-12-07 ENCOUNTER — Encounter: Payer: Self-pay | Admitting: Family

## 2021-12-07 ENCOUNTER — Ambulatory Visit (INDEPENDENT_AMBULATORY_CARE_PROVIDER_SITE_OTHER): Payer: BC Managed Care – PPO | Admitting: Family

## 2021-12-07 VITALS — BP 130/85 | HR 92 | Temp 98.3°F | Resp 18 | Ht 75.16 in | Wt 355.0 lb

## 2021-12-07 DIAGNOSIS — Z1329 Encounter for screening for other suspected endocrine disorder: Secondary | ICD-10-CM

## 2021-12-07 DIAGNOSIS — Z131 Encounter for screening for diabetes mellitus: Secondary | ICD-10-CM | POA: Diagnosis not present

## 2021-12-07 DIAGNOSIS — F411 Generalized anxiety disorder: Secondary | ICD-10-CM | POA: Diagnosis not present

## 2021-12-07 DIAGNOSIS — Z0000 Encounter for general adult medical examination without abnormal findings: Secondary | ICD-10-CM

## 2021-12-07 DIAGNOSIS — Z130 Encounter for screening for diseases of the blood and blood-forming organs and certain disorders involving the immune mechanism: Secondary | ICD-10-CM

## 2021-12-07 DIAGNOSIS — Z7689 Persons encountering health services in other specified circumstances: Secondary | ICD-10-CM

## 2021-12-07 DIAGNOSIS — E782 Mixed hyperlipidemia: Secondary | ICD-10-CM | POA: Diagnosis not present

## 2021-12-07 DIAGNOSIS — Z13228 Encounter for screening for other metabolic disorders: Secondary | ICD-10-CM

## 2021-12-07 DIAGNOSIS — F4310 Post-traumatic stress disorder, unspecified: Secondary | ICD-10-CM | POA: Diagnosis not present

## 2021-12-07 DIAGNOSIS — F329 Major depressive disorder, single episode, unspecified: Secondary | ICD-10-CM | POA: Diagnosis not present

## 2021-12-07 NOTE — Progress Notes (Signed)
Pt presents for annual physical  Wants to discuss weight loss options but declined to weigh at start of visit

## 2021-12-07 NOTE — Patient Instructions (Addendum)
Preventive Care 21-26 Years Old, Male Preventive care refers to lifestyle choices and visits with your health care provider that can promote health and wellness. Preventive care visits are also called wellness exams. What can I expect for my preventive care visit? Counseling During your preventive care visit, your health care provider may ask about your: Medical history, including: Past medical problems. Family medical history. Current health, including: Emotional well-being. Home life and relationship well-being. Sexual activity. Lifestyle, including: Alcohol, nicotine or tobacco, and drug use. Access to firearms. Diet, exercise, and sleep habits. Safety issues such as seatbelt and bike helmet use. Sunscreen use. Work and work environment. Physical exam Your health care provider may check your: Height and weight. These may be used to calculate your BMI (body mass index). BMI is a measurement that tells if you are at a healthy weight. Waist circumference. This measures the distance around your waistline. This measurement also tells if you are at a healthy weight and may help predict your risk of certain diseases, such as type 2 diabetes and high blood pressure. Heart rate and blood pressure. Body temperature.  Vaccines are usually given at various ages, according to a schedule. Your health care provider will recommend vaccines for you based on your age, medical history, and lifestyle or other factors, such as travel or where you work. What tests do I need? Screening Your health care provider may recommend screening tests for certain conditions. This may include: Lipid and cholesterol levels. Diabetes screening. This is done by checking your blood sugar (glucose) after you have not eaten for a while (fasting). Hepatitis B test. Hepatitis C test. HIV (human immunodeficiency virus) test. STI (sexually transmitted infection) testing, if you are at risk. Talk with your health care  provider about your test results, treatment options, and if necessary, the need for more tests. Follow these instructions at home: Eating and drinking  Eat a healthy diet that includes fresh fruits and vegetables, whole grains, lean protein, and low-fat dairy products. Drink enough fluid to keep your urine pale yellow. Take vitamin and mineral supplements as recommended by your health care provider. Do not drink alcohol if your health care provider tells you not to drink. If you drink alcohol: Limit how much you have to 0-2 drinks a day. Know how much alcohol is in your drink. In the [place] one drink equals one 12 oz bottle of beer (355 mL), one 5 oz glass of wine (148 mL), or one 1 oz glass of hard liquor (44 mL). Lifestyle Brush your teeth every morning and night with fluoride toothpaste. Floss one time each day. Exercise for at least 30 minutes 5 or more days each week. Do not use any products that contain nicotine or tobacco. These products include cigarettes, chewing tobacco, and vaping devices, such as e-cigarettes. If you need help quitting, ask your health care provider. Do not use drugs. If you are sexually active, practice safe sex. Use a condom or other form of protection to prevent STIs. Find healthy ways to manage stress, such as: Meditation, yoga, or listening to music. Journaling. Talking to a trusted person. Spending time with friends and family. Minimize exposure to UV radiation to reduce your risk of skin cancer. Safety Always wear your seat belt while driving or riding in a vehicle. Do not drive: If you have been drinking alcohol. Do not ride with someone who has been drinking. If you have been using any mind-altering substances or drugs. While texting. When you are tired or   distracted. Wear a helmet and other protective equipment during sports activities. If you have firearms in your house, make sure you follow all gun safety procedures. Seek help if you have been  physically or sexually abused. What's next? Go to your health care provider once a year for an annual wellness visit. Ask your health care provider how often you should have your eyes and teeth checked. Stay up to date on all vaccines. This information is not intended to replace advice given to you by your health care provider. Make sure you discuss any questions you have with your health care provider. Document Revised: 01/04/2021 Document Reviewed: 01/04/2021 Elsevier Patient Education  2023 Elsevier Inc.  Semaglutide Injection (Weight Management) What is this medication? SEMAGLUTIDE (SEM a GLOO tide) promotes weight loss. It may also be used to maintain weight loss. It works by decreasing appetite. Changes to diet and exercise are often combined with this medication. This medicine may be used for other purposes; ask your health care provider or pharmacist if you have questions. COMMON BRAND NAME(S): [id number] What should I tell my care team before I take this medication? They need to know if you have any of these conditions: Endocrine tumors (MEN 2) or if someone in your family had these tumors Eye disease, vision problems Gallbladder disease History of depression or mental health disease History of pancreatitis Kidney disease Stomach or intestine problems Suicidal thoughts, plans, or attempt; a previous suicide attempt by you or a family member Thyroid cancer or if someone in your family had thyroid cancer An unusual or allergic reaction to semaglutide, other medications, foods, dyes, or preservatives Pregnant or trying to get pregnant Breast-feeding How should I use this medication? This medication is injected under the skin. You will be taught how to prepare and give it. Take it as directed on the prescription label. It is given once every week (every 7 days). Keep taking it unless your care team tells you to stop. It is important that you put your used needles and pens in a special  sharps container. Do not put them in a trash can. If you do not have a sharps container, call your pharmacist or care team to get one. A special MedGuide will be given to you by the pharmacist with each prescription and refill. Be sure to read this information carefully each time. This medication comes with INSTRUCTIONS FOR USE. Ask your pharmacist for directions on how to use this medication. Read the information carefully. Talk to your pharmacist or care team if you have questions. Talk to your care team about the use of this medication in children. While it may be prescribed for children as young as 12 years for selected conditions, precautions do apply. Overdosage: If you think you have taken too much of this medicine contact a poison control center or emergency room at once. NOTE: This medicine is only for you. Do not share this medicine with others. What if I miss a dose? If you miss a dose and the next scheduled dose is more than 2 days away, take the missed dose as soon as possible. If you miss a dose and the next scheduled dose is less than 2 days away, do not take the missed dose. Take the next dose at your regular time. Do not take double or extra doses. If you miss your dose for 2 weeks or more, take the next dose at your regular time or call your care team to talk about how to restart  this medication. What may interact with this medication? Insulin and other medications for diabetes This list may not describe all possible interactions. Give your health care provider a list of all the medicines, herbs, non-prescription drugs, or dietary supplements you use. Also tell them if you smoke, drink alcohol, or use illegal drugs. Some items may interact with your medicine. What should I watch for while using this medication? Visit your care team for regular checks on your progress. It may be some time before you see the benefit from this medication. Drink plenty of fluids while taking this  medication. Check with your care team if you have severe diarrhea, nausea, and vomiting, or if you sweat a lot. The loss of too much body fluid may make it dangerous for you to take this medication. This medication may affect blood sugar levels. Ask your care team if changes in diet or medications are needed if you have diabetes. If you or your family notice any changes in your behavior, such as new or worsening depression, thoughts of harming yourself, anxiety, other unusual or disturbing thoughts, or memory loss, call your care team right away. Women should inform their care team if they wish to become pregnant or think they might be pregnant. Losing weight while pregnant is not advised and may cause harm to the unborn child. Talk to your care team for more information. What side effects may I notice from receiving this medication? Side effects that you should report to your care team as soon as possible: Allergic reactions--skin rash, itching, hives, swelling of the face, lips, tongue, or throat Change in vision Dehydration--increased thirst, dry mouth, feeling faint or lightheaded, headache, dark yellow or brown urine Gallbladder problems--severe stomach pain, nausea, vomiting, fever Heart palpitations--rapid, pounding, or irregular heartbeat Kidney injury--decrease in the amount of urine, swelling of the ankles, hands, or feet Pancreatitis--severe stomach pain that spreads to your back or gets worse after eating or when touched, fever, nausea, vomiting Thoughts of suicide or self-harm, worsening mood, feelings of depression Thyroid cancer--new mass or lump in the neck, pain or trouble swallowing, trouble breathing, hoarseness Side effects that usually do not require medical attention (report to your care team if they continue or are bothersome): Diarrhea Loss of appetite Nausea Stomach pain Vomiting This list may not describe all possible side effects. Call your doctor for medical advice  about side effects. You may report side effects to FDA at 1-800-FDA-1088. Where should I keep my medication? Keep out of the reach of children and pets. Refrigeration (preferred): Store in the refrigerator. Do not freeze. Keep this medication in the original container until you are ready to take it. Get rid of any unused medication after the expiration date. Room temperature: If needed, prior to cap removal, the pen can be stored at room temperature for up to 28 days. Protect from light. If it is stored at room temperature, get rid of any unused medication after 28 days or after it expires, whichever is first. It is important to get rid of the medication as soon as you no longer need it or it is expired. You can do this in two ways: Take the medication to a medication take-back program. Check with your pharmacy or law enforcement to find a location. If you cannot return the medication, follow the directions in the MedGuide. NOTE: This sheet is a summary. It may not cover all possible information. If you have questions about this medicine, talk to your doctor, pharmacist, or health care  provider.  2023 Elsevier/Gold Standard (2021-07-26 00:00:00)

## 2021-12-08 ENCOUNTER — Other Ambulatory Visit: Payer: Self-pay | Admitting: Family

## 2021-12-08 ENCOUNTER — Encounter: Payer: Self-pay | Admitting: Family

## 2021-12-08 ENCOUNTER — Encounter: Payer: 59 | Admitting: Family

## 2021-12-08 DIAGNOSIS — E782 Mixed hyperlipidemia: Secondary | ICD-10-CM

## 2021-12-08 DIAGNOSIS — Z7689 Persons encountering health services in other specified circumstances: Secondary | ICD-10-CM

## 2021-12-08 LAB — BASIC METABOLIC PANEL
BUN/Creatinine Ratio: 9 (ref 9–20)
BUN: 7 mg/dL (ref 6–20)
CO2: 20 mmol/L (ref 20–29)
Calcium: 9.1 mg/dL (ref 8.7–10.2)
Chloride: 102 mmol/L (ref 96–106)
Creatinine, Ser: 0.76 mg/dL (ref 0.76–1.27)
Glucose: 80 mg/dL (ref 70–99)
Potassium: 4.6 mmol/L (ref 3.5–5.2)
Sodium: 139 mmol/L (ref 134–144)
eGFR: 127 mL/min/{1.73_m2} (ref >59)

## 2021-12-08 LAB — LIPID PANEL
Chol/HDL Ratio: 6.7 ratio — ABNORMAL HIGH (ref 0.0–5.0)
Cholesterol, Total: 228 mg/dL — ABNORMAL HIGH (ref 100–199)
HDL: 34 mg/dL — ABNORMAL LOW (ref >39)
LDL Chol Calc (NIH): 140 mg/dL — ABNORMAL HIGH (ref 0–99)
Triglycerides: 298 mg/dL — ABNORMAL HIGH (ref 0–149)
VLDL Cholesterol Cal: 54 mg/dL — ABNORMAL HIGH (ref 5–40)

## 2021-12-08 LAB — CBC
Hematocrit: 48.6 % (ref 37.5–51.0)
Hemoglobin: 16.6 g/dL (ref 13.0–17.7)
MCH: 30.2 pg (ref 26.6–33.0)
MCHC: 34.2 g/dL (ref 31.5–35.7)
MCV: 88 fL (ref 79–97)
Platelets: 349 10*3/uL (ref 150–450)
RBC: 5.5 x10E6/uL (ref 4.14–5.80)
RDW: 12.7 % (ref 11.6–15.4)
WBC: 6.2 10*3/uL (ref 3.4–10.8)

## 2021-12-08 LAB — HEMOGLOBIN A1C
Est. average glucose Bld gHb Est-mCnc: 114 mg/dL
Hgb A1c MFr Bld: 5.6 % (ref 4.8–5.6)

## 2021-12-08 LAB — TSH: TSH: 1.57 u[IU]/mL (ref 0.450–4.500)

## 2021-12-08 MED ORDER — ATORVASTATIN CALCIUM 20 MG PO TABS: 20.0000 mg | ORAL_TABLET | Freq: Every day | ORAL | 0 refills | Status: DC

## 2021-12-08 MED ORDER — SEMAGLUTIDE-WEIGHT MANAGEMENT 0.25 MG/0.5ML ~~LOC~~ SOAJ: 0.2500 mg | SUBCUTANEOUS | 0 refills | Status: AC

## 2021-12-08 MED ORDER — ONDANSETRON HCL 4 MG PO TABS: 4.0000 mg | ORAL_TABLET | Freq: Three times a day (TID) | ORAL | 0 refills | Status: DC | PRN

## 2021-12-08 NOTE — Progress Notes (Signed)
-   Kidney function normal. - Thyroid function normal.  - No anemia.  - No diabetes.   The following abnormalities are noted:   - Cholesterol increased since 12 months ago.   All other values are normal, stable or within acceptable limits.  Medication changes / Follow up labs / Other changes or recommendations:   - Recommendation to resume Atorvastatin for high cholesterol. Please call our office to schedule fasting cholesterol lab in 4 to 6 weeks.   Camillia Herter, NP 12/08/2021 7:37 AM

## 2021-12-11 NOTE — Telephone Encounter (Signed)
Hi Jonathan Kaufman,   Unfortunately there is nothing more we can do to get [facility] covered from a medical standpoint. You may want to consider contacting your health insurance carrier to see which medications are covered under your plan if any. Any further questions/concerns please let me know.

## 2021-12-14 ENCOUNTER — Other Ambulatory Visit: Payer: BC Managed Care – PPO

## 2021-12-14 DIAGNOSIS — E782 Mixed hyperlipidemia: Secondary | ICD-10-CM

## 2021-12-14 NOTE — Progress Notes (Signed)
Lipid panel completed-pt is fasting

## 2021-12-15 LAB — LIPID PANEL
Chol/HDL Ratio: 4.8 ratio (ref 0.0–5.0)
Cholesterol, Total: 178 mg/dL (ref 100–199)
HDL: 37 mg/dL — ABNORMAL LOW (ref >39)
LDL Chol Calc (NIH): 109 mg/dL — ABNORMAL HIGH (ref 0–99)
Triglycerides: 181 mg/dL — ABNORMAL HIGH (ref 0–149)
VLDL Cholesterol Cal: 32 mg/dL (ref 5–40)

## 2021-12-15 NOTE — Progress Notes (Signed)
Cholesterol remaining above goal but improved since 8 days ago. Recheck fasting cholesterol in 3 to 6 months.

## 2021-12-28 DIAGNOSIS — Z713 Dietary counseling and surveillance: Secondary | ICD-10-CM | POA: Diagnosis not present

## 2022-01-04 DIAGNOSIS — Z713 Dietary counseling and surveillance: Secondary | ICD-10-CM | POA: Diagnosis not present

## 2022-01-12 DIAGNOSIS — Z713 Dietary counseling and surveillance: Secondary | ICD-10-CM | POA: Diagnosis not present

## 2022-01-16 NOTE — Progress Notes (Signed)
Patient ID: Jonathan Kaufman, male    DOB: Feb 28, 1996  MRN: [id number]  CC: Ringing in ear  Subjective: Jonathan Kaufman is a 26 y.o. male who presents for ringing in ear.   His concerns today include:  Ringing in ear: Duration: week Description of tinnitus: initially intermittent high pitch now constant dull pitch and worse at night Pulsatile: no Tinnitus duration: continuous Episode frequency: continous Severity: moderate Head injury: no Vertigo:no Hearing loss: no Headache:no  TMJ syndrome symptoms: no Unsteady gait: no Postural instability: no Diplopia, dysarthria, dysphagia or weakness: no Comments: reports swimming more frequently    Patient Active Problem List   Diagnosis Date Noted   Fever, unspecified 05/25/2021   Flu-like symptoms 05/25/2021   NAFLD (nonalcoholic fatty liver disease) 95/63/8756   Low serum testosterone level 10/18/2019   Elevated ALT measurement 11/28/2018   Mixed hyperlipidemia 11/26/2018   Family history of thyroid disease 05/30/2018   Not immune to hepatitis B virus 03/07/2017   Seasonal allergies 01/20/2015     Current Outpatient Medications on File Prior to Visit  Medication Sig Dispense Refill   atorvastatin (LIPITOR) 20 MG tablet Take 1 tablet (20 mg total) by mouth daily. 90 tablet 0   DESCOVY 200-25 MG tablet Take 1 tablet by mouth daily.     ondansetron (ZOFRAN) 4 MG tablet Take 1 tablet (4 mg total) by mouth every 8 (eight) hours as needed for nausea or vomiting. 30 tablet 0   No current facility-administered medications on file prior to visit.    Allergies  Allergen Reactions   Apple Rash    Itchy throat   Proanthocyanidin Itching   Banana     Itchy throat   Eggs-Apples-Oats [Alimentum]    Other     Cats and bunnies    Social History   Socioeconomic History   Marital status: Married    Spouse name: Not on file   Number of children: Not on file   Years of education: Not on file   Highest education level: Not on file   Occupational History   Not on file  Tobacco Use   Smoking status: Never    Passive exposure: Never   Smokeless tobacco: Never  Vaping Use   Vaping Use: Never used  Substance and Sexual Activity   Alcohol use: Not Currently   Drug use: No   Sexual activity: Not on file  Other Topics Concern   Not on file  Social History Narrative   Not on file   Social Determinants of Health   Financial Resource Strain: Not on file  Food Insecurity: Not on file  Transportation Needs: Not on file  Physical Activity: Not on file  Stress: Not on file  Social Connections: Not on file  Intimate Partner Violence: Not on file    Family History  Problem Relation Age of Onset   Depression Mother    Bipolar disorder Father    Schizophrenia Father    Thyroid disease Paternal Grandmother     Past Surgical History:  Procedure Laterality Date   MOUTH SURGERY      ROS: Review of Systems Negative except as stated above  PHYSICAL EXAM: BP 111/77 (BP Location: Left Arm, Patient Position: Sitting, Cuff Size: Large)   Pulse 86   Temp 98.3 F (36.8 C)   Resp 16   Wt (!) 353 lb (160.1 kg)   SpO2 96%   BMI 43.94 kg/m   Physical Exam HENT:     Head: Normocephalic  and atraumatic.     Right Ear: Tympanic membrane, ear canal and external ear normal.     Left Ear: Tympanic membrane, ear canal and external ear normal.     Nose: Nose normal.     Mouth/Throat:     Mouth: Mucous membranes are moist.     Pharynx: Oropharynx is clear.  Eyes:     Extraocular Movements: Extraocular movements intact.     Conjunctiva/sclera: Conjunctivae normal.     Pupils: Pupils are equal, round, and reactive to light.  Cardiovascular:     Rate and Rhythm: Normal rate and regular rhythm.     Pulses: Normal pulses.     Heart sounds: Normal heart sounds.  Pulmonary:     Effort: Pulmonary effort is normal.     Breath sounds: Normal breath sounds.  Musculoskeletal:     Cervical back: Normal range of motion and  neck supple.  Neurological:     General: No focal deficit present.     Mental Status: He is alert and oriented to person, place, and time.  Psychiatric:        Mood and Affect: Mood normal.        Behavior: Behavior normal.    ASSESSMENT AND PLAN: 1. Tinnitus of left ear - Exam unremarkable.  - Referral to ENT for further evaluation and management.  - Ambulatory referral to ENT   Patient was given the opportunity to ask questions.  Patient verbalized understanding of the plan and was able to repeat key elements of the plan. Patient was given clear instructions to go to Emergency Department or return to medical center if symptoms don't improve, worsen, or new problems develop.The patient verbalized understanding.   Orders Placed This Encounter  Procedures   Ambulatory referral to ENT    Follow-up with primary provider as scheduled.   Rema Fendt, NP

## 2022-01-17 ENCOUNTER — Encounter: Payer: Self-pay | Admitting: Family

## 2022-01-17 ENCOUNTER — Ambulatory Visit (INDEPENDENT_AMBULATORY_CARE_PROVIDER_SITE_OTHER): Payer: BC Managed Care – PPO | Admitting: Family

## 2022-01-17 VITALS — BP 111/77 | HR 86 | Temp 98.3°F | Resp 16 | Wt 353.0 lb

## 2022-01-17 DIAGNOSIS — H9312 Tinnitus, left ear: Secondary | ICD-10-CM | POA: Diagnosis not present

## 2022-01-17 NOTE — Progress Notes (Signed)
Pt presents for tinnitus experiencing for week, denies any headache or blurred vision

## 2022-01-19 DIAGNOSIS — Z713 Dietary counseling and surveillance: Secondary | ICD-10-CM | POA: Diagnosis not present

## 2022-01-26 ENCOUNTER — Encounter: Payer: Self-pay | Admitting: Family

## 2022-01-26 ENCOUNTER — Ambulatory Visit (INDEPENDENT_AMBULATORY_CARE_PROVIDER_SITE_OTHER): Payer: BC Managed Care – PPO | Admitting: Family

## 2022-01-26 VITALS — BP 108/73 | HR 108 | Temp 98.3°F | Resp 16 | Wt 360.0 lb

## 2022-01-26 DIAGNOSIS — H9312 Tinnitus, left ear: Secondary | ICD-10-CM

## 2022-01-26 DIAGNOSIS — H9209 Otalgia, unspecified ear: Secondary | ICD-10-CM

## 2022-01-26 DIAGNOSIS — R0989 Other specified symptoms and signs involving the circulatory and respiratory systems: Secondary | ICD-10-CM | POA: Diagnosis not present

## 2022-01-26 DIAGNOSIS — J029 Acute pharyngitis, unspecified: Secondary | ICD-10-CM

## 2022-01-26 LAB — POCT RAPID STREP A (OFFICE): Rapid Strep A Screen: NEGATIVE

## 2022-01-26 MED ORDER — AMOXICILLIN-POT CLAVULANATE 875-125 MG PO TABS: 1.0000 | ORAL_TABLET | Freq: Two times a day (BID) | ORAL | 0 refills | Status: DC

## 2022-01-26 MED ORDER — PREDNISONE 10 MG PO TABS: ORAL_TABLET | ORAL | 0 refills | Status: AC

## 2022-01-26 MED ORDER — NYSTATIN 100000 UNIT/ML MT SUSP: 5.0000 mL | Freq: Four times a day (QID) | OROMUCOSAL | 0 refills | Status: DC

## 2022-01-26 NOTE — Progress Notes (Signed)
Patient ID: Jonathan Kaufman, male    DOB: [birth year]  MRN: [id number]  CC: Ear Pain and Sore Throat   Subjective: Jonathan Kaufman is a 26 y.o. male who presents for ear pain and sore throat.  His concerns today include:  Reports left ear pain, sore throat with white spots, and sinus-related congestion. Has not tried anything over-the-counter as of present. Has not heard from ENT referral. Concern for strep and mono. Declines Covid/flu testing. No further issues/concerns.   Patient Active Problem List   Diagnosis Date Noted   Fever, unspecified 05/25/2021   Flu-like symptoms 05/25/2021   NAFLD (nonalcoholic fatty liver disease) 28/36/6294   Low serum testosterone level 10/18/2019   Elevated ALT measurement 11/28/2018   Mixed hyperlipidemia 11/26/2018   Family history of thyroid disease 05/30/2018   Not immune to hepatitis B virus 03/07/2017   Seasonal allergies 01/20/2015     Current Outpatient Medications on File Prior to Visit  Medication Sig Dispense Refill   atorvastatin (LIPITOR) 20 MG tablet Take 1 tablet (20 mg total) by mouth daily. 90 tablet 0   DESCOVY 200-25 MG tablet Take 1 tablet by mouth daily.     ondansetron (ZOFRAN) 4 MG tablet Take 1 tablet (4 mg total) by mouth every 8 (eight) hours as needed for nausea or vomiting. 30 tablet 0   No current facility-administered medications on file prior to visit.    Allergies  Allergen Reactions   Apple Rash    Itchy throat   Proanthocyanidin Itching   Banana     Itchy throat   Eggs-Apples-Oats [Alimentum]    Other     Cats and bunnies    Social History   Socioeconomic History   Marital status: Married    Spouse name: Not on file   Number of children: Not on file   Years of education: Not on file   Highest education level: Not on file  Occupational History   Not on file  Tobacco Use   Smoking status: Never    Passive exposure: Never   Smokeless tobacco: Never  Vaping Use   Vaping Use: Never used  Substance  and Sexual Activity   Alcohol use: Not Currently   Drug use: No   Sexual activity: Not on file  Other Topics Concern   Not on file  Social History Narrative   Not on file   Social Determinants of Health   Financial Resource Strain: Not on file  Food Insecurity: Not on file  Transportation Needs: Not on file  Physical Activity: Not on file  Stress: Not on file  Social Connections: Not on file  Intimate Partner Violence: Not on file    Family History  Problem Relation Age of Onset   Depression Mother    Bipolar disorder Father    Schizophrenia Father    Thyroid disease Paternal Grandmother     Past Surgical History:  Procedure Laterality Date   MOUTH SURGERY      ROS: Review of Systems Negative except as stated above  PHYSICAL EXAM: BP 108/73   Pulse (!) 108   Temp 98.3 F (36.8 C) (Oral)   Resp 16   Wt (!) 360 lb (163.3 kg)   SpO2 94%   BMI 44.81 kg/m    Physical Exam HENT:     Head: Normocephalic and atraumatic.     Right Ear: Tympanic membrane and ear canal normal.     Left Ear: Tympanic membrane and ear canal normal.  Mouth/Throat:     Mouth: Mucous membranes are moist.     Pharynx: Oropharyngeal exudate present.  Eyes:     Extraocular Movements: Extraocular movements intact.     Conjunctiva/sclera: Conjunctivae normal.     Pupils: Pupils are equal, round, and reactive to light.  Cardiovascular:     Rate and Rhythm: Normal rate and regular rhythm.     Pulses: Normal pulses.     Heart sounds: Normal heart sounds.  Pulmonary:     Effort: Pulmonary effort is normal.     Breath sounds: Normal breath sounds.  Musculoskeletal:     Cervical back: Normal range of motion and neck supple.  Neurological:     General: No focal deficit present.     Mental Status: He is alert and oriented to person, place, and time.  Psychiatric:        Mood and Affect: Mood normal.        Behavior: Behavior normal.   Results for orders placed or performed in visit  on 01/26/22  Rapid Strep A  Result Value Ref Range   Rapid Strep A Screen Negative Negative    ASSESSMENT AND PLAN: 1. Earache - Amoxicillin-Clavulanate and Prednisone as prescribed.  - amoxicillin-clavulanate (AUGMENTIN) 875-125 MG tablet; Take 1 tablet by mouth 2 (two) times daily for 5 days.  Dispense: 10 tablet; Refill: 0 - predniSONE (DELTASONE) 10 MG tablet; Take 6 tablets (60 mg total) by mouth daily with breakfast for 1 day, THEN 5 tablets (50 mg total) daily with breakfast for 1 day, THEN 4 tablets (40 mg total) daily with breakfast for 1 day, THEN 3 tablets (30 mg total) daily with breakfast for 1 day, THEN 2 tablets (20 mg total) daily with breakfast for 1 day, THEN 1 tablet (10 mg total) daily with breakfast for 1 day.  Dispense: 21 tablet; Refill: 0  2. Sore throat - Rapid Strep A negative. Sending culture for further evaluation. - Mono screening.  - Nystatin suspension as prescribed.  - Rapid Strep A - Culture, Group A Strep - Mono, Qual W/Rflx if Negative - nystatin (MYCOSTATIN) 100000 UNIT/ML suspension; Take 5 mLs (500,000 Units total) by mouth 4 (four) times daily.  Dispense: 60 mL; Refill: 0  3. Tinnitus of left ear - Provided with contact information to ENT referral. [facility] phone # [phone] Address [address].  Patient was given the opportunity to ask questions.  Patient verbalized understanding of the plan and was able to repeat key elements of the plan. Patient was given clear instructions to go to Emergency Department or return to medical center if symptoms don't improve, worsen, or new problems develop.The patient verbalized understanding.   Orders Placed This Encounter  Procedures   Culture, Group A Strep   Mono, Qual W/Rflx if Negative   Rapid Strep A     Requested Prescriptions   Signed Prescriptions Disp Refills   amoxicillin-clavulanate (AUGMENTIN) 875-125 MG tablet 10 tablet 0    Sig: Take 1  tablet by mouth 2 (two) times daily for 5 days.   predniSONE (DELTASONE) 10 MG tablet 21 tablet 0    Sig: Take 6 tablets (60 mg total) by mouth daily with breakfast for 1 day, THEN 5 tablets (50 mg total) daily with breakfast for 1 day, THEN 4 tablets (40 mg total) daily with breakfast for 1 day, THEN 3 tablets (30 mg total) daily with breakfast for 1 day, THEN 2 tablets (20 mg total)  daily with breakfast for 1 day, THEN 1 tablet (10 mg total) daily with breakfast for 1 day.   nystatin (MYCOSTATIN) 100000 UNIT/ML suspension 60 mL 0    Sig: Take 5 mLs (500,000 Units total) by mouth 4 (four) times daily.    Follow-up with primary provider as scheduled.   Camillia Herter, NP

## 2022-01-26 NOTE — Progress Notes (Signed)
Patient c/o ear pain, sore throat  and head congestion. Patient said this has been present for a few days.

## 2022-01-26 NOTE — Patient Instructions (Addendum)
Contact information to ENT referral. [facility] phone # [phone] Address [address]  [place], Adult An earache, or ear pain, can be caused by many things, including: An infection. Ear wax buildup. Ear pressure. Something in the ear that should not be there (foreign body). A sore throat. Tooth problems. Jaw problems. Treatment of the earache will depend on the cause. If the cause is not clear or cannot be determined, you may need to watch your symptoms until your earache goes away or until a cause is found. Follow these instructions at home: Medicines Take or apply over-the-counter and prescription medicines only as told by your health care provider. If you were prescribed an antibiotic medicine, use it as told by your health care provider. Do not stop using the antibiotic even if you start to feel better. Do not put anything in your ear other than medicine that is prescribed by your health care provider. Managing pain If directed, apply heat to the affected area as often as told by your health care provider. Use the heat source that your health care provider recommends, such as a moist heat pack or a heating pad. Place a towel between your skin and the heat source. Leave the heat on for 20-30 minutes. Remove the heat if your skin turns bright red. This is especially important if you are unable to feel pain, heat, or cold. You may have a greater risk of getting burned. If directed, put ice on the affected area as often as told by your health care provider. To do this:     Put ice in a plastic bag. Place a towel between your skin and the bag. Leave the ice on for 20 minutes, 2-3 times a day. General instructions Pay attention to any changes in your symptoms. Try resting in an upright position instead of lying down. This may help to reduce pressure in your ear and relieve pain. Chew gum if it helps to relieve your ear pain. Treat any  allergies as told by your health care provider. Drink enough fluid to keep your urine pale yellow. It is up to you to get the results of any tests that were done. Ask your health care provider, or the department that is doing the tests, when your results will be ready. Keep all follow-up visits as told by your health care provider. This is important. Contact a health care provider if: Your pain does not improve within 2 days. Your earache gets worse. You have new symptoms. You have a fever. Get help right away if you: Have a severe headache. Have a stiff neck. Have trouble swallowing. Have redness or swelling behind your ear. Have fluid or blood coming from your ear. Have hearing loss. Feel dizzy. Summary An earache, or ear pain, can be caused by many things. Treatment of the earache will depend on the cause. Follow recommendations from your health care provider to treat your ear pain. If the cause is not clear or cannot be determined, you may need to watch your symptoms until your earache goes away or until a cause is found. Keep all follow-up visits as told by your health care provider. This is important. This information is not intended to replace advice given to you by your health care provider. Make sure you discuss any questions you have with your health care provider. Document Revised: 02/13/2019 Document Reviewed: 02/14/2019 Elsevier Patient Education  2023 [employer].

## 2022-01-30 ENCOUNTER — Other Ambulatory Visit: Payer: Self-pay | Admitting: Family

## 2022-01-30 DIAGNOSIS — B950 Streptococcus, group A, as the cause of diseases classified elsewhere: Secondary | ICD-10-CM

## 2022-01-30 DIAGNOSIS — F4310 Post-traumatic stress disorder, unspecified: Secondary | ICD-10-CM | POA: Diagnosis not present

## 2022-01-30 DIAGNOSIS — F329 Major depressive disorder, single episode, unspecified: Secondary | ICD-10-CM | POA: Diagnosis not present

## 2022-01-30 DIAGNOSIS — F411 Generalized anxiety disorder: Secondary | ICD-10-CM | POA: Diagnosis not present

## 2022-01-30 LAB — CULTURE, GROUP A STREP

## 2022-01-30 MED ORDER — AMOXICILLIN-POT CLAVULANATE 875-125 MG PO TABS: 1.0000 | ORAL_TABLET | Freq: Two times a day (BID) | ORAL | 0 refills | Status: AC

## 2022-02-02 DIAGNOSIS — Z713 Dietary counseling and surveillance: Secondary | ICD-10-CM | POA: Diagnosis not present

## 2022-02-04 ENCOUNTER — Other Ambulatory Visit: Payer: Self-pay | Admitting: Family

## 2022-02-04 DIAGNOSIS — B2790 Infectious mononucleosis, unspecified without complication: Secondary | ICD-10-CM

## 2022-02-04 LAB — EBV ACUTE INFECTION ANTIBODIES
EBV Early Antigen Ab, IgG: <9 U/mL (ref 0.0–8.9)
EBV NA IgG: 268 U/mL — ABNORMAL HIGH (ref 0.0–17.9)
EBV VCA IgG: 67.2 U/mL — ABNORMAL HIGH (ref 0.0–17.9)
EBV VCA IgM: <36 U/mL (ref 0.0–35.9)

## 2022-02-04 LAB — MONO, QUAL W/RFLX IF NEGATIVE: Mono Screen: NEGATIVE

## 2022-02-07 ENCOUNTER — Telehealth: Payer: Self-pay | Admitting: *Deleted

## 2022-02-07 ENCOUNTER — Encounter: Payer: Self-pay | Admitting: Family

## 2022-02-07 NOTE — Telephone Encounter (Signed)
Noted  

## 2022-02-07 NOTE — Telephone Encounter (Signed)
Pt states saw MyChart message regarding ED evaluation. Reiterated PCP advise was to be seen in ED.   Questioning reasoning for ED visit. States feeling better. "Seems funny to go to ED and tell them I feel fine but was told to come here." Please advise further. [phone]

## 2022-02-07 NOTE — Telephone Encounter (Signed)
Yes

## 2022-02-07 NOTE — Telephone Encounter (Signed)
PCP would like for pt to follow-up with ED, it is up to patient whether he would like to follow the instructions of his provider.

## 2022-02-07 NOTE — Telephone Encounter (Signed)
Pt. Calling again. States he dies not know what he is supposed to tell the ED and he wants to know what other treatment he needs that will be given in the ED. He wants to speak to PCP about specifics. Please advise pt.

## 2022-02-09 NOTE — Telephone Encounter (Signed)
Noted  

## 2022-02-09 NOTE — Telephone Encounter (Signed)
FYI  Called patient to try and help clarify his questions. Patient said he see know reason to go to hospital and he would really like to speak with provider.

## 2022-02-12 ENCOUNTER — Telehealth: Payer: Self-pay

## 2022-02-12 NOTE — Telephone Encounter (Signed)
Patient called to schedule for new patient visit for EBV. Says he was told by his PCP that he needed to go to the ED. Patient states he feels "great" and has no symptoms whatsoever and is wondering if he really needs to go to the ED. Advised him that there is no reason to go to the emergency department if he is experiencing no symptoms or distress. Scheduled to see Dr. Earlene Plater 7/28.   Sandie Ano, RN

## 2022-02-13 DIAGNOSIS — F4310 Post-traumatic stress disorder, unspecified: Secondary | ICD-10-CM | POA: Diagnosis not present

## 2022-02-13 DIAGNOSIS — F411 Generalized anxiety disorder: Secondary | ICD-10-CM | POA: Diagnosis not present

## 2022-02-13 DIAGNOSIS — F329 Major depressive disorder, single episode, unspecified: Secondary | ICD-10-CM | POA: Diagnosis not present

## 2022-02-16 ENCOUNTER — Ambulatory Visit: Payer: BC Managed Care – PPO | Admitting: Internal Medicine

## 2022-02-16 ENCOUNTER — Encounter: Payer: Self-pay | Admitting: Internal Medicine

## 2022-02-16 ENCOUNTER — Other Ambulatory Visit: Payer: Self-pay

## 2022-02-16 VITALS — BP 124/82 | HR 89 | Temp 97.3°F | Ht 76.0 in | Wt 350.0 lb

## 2022-02-16 DIAGNOSIS — B2790 Infectious mononucleosis, unspecified without complication: Secondary | ICD-10-CM | POA: Diagnosis not present

## 2022-02-16 DIAGNOSIS — J029 Acute pharyngitis, unspecified: Secondary | ICD-10-CM

## 2022-02-16 DIAGNOSIS — Z713 Dietary counseling and surveillance: Secondary | ICD-10-CM | POA: Diagnosis not present

## 2022-02-16 NOTE — Patient Instructions (Signed)
Thank you for coming to see me today. It was a pleasure seeing you.  To Do: Blood work today I will update you next week with the result  If you have any questions or concerns, please do not hesitate to call the office at [phone].  Take Care,   Gwynn Burly

## 2022-02-16 NOTE — Progress Notes (Signed)
[facility]  Reason for Consult: EBV   Referring Provider: Ricky Stabs, NP   HPI:    Jonathan Kaufman is a 26 y.o. male with PMHx as below who presents to the clinic for EBV.   Patient was seen at his primary care office on 01/26/22 for ear pain and sore throat as well as congestion.  He declined COVID and flu testing, but did have a rapid Strep A which was negative.  This was sent for culture and patient was prescribed Augmentin x 5 days regardless for his earache.  His group A strep cultures grew a Beta-hemolytic colonies, not group A strep and his Augmentin was extended another 5 days.  His Monospot was negative and reflex EBV antibodies showed negative EBV VCA IgM, negative EBV early antigen Ab IgG, positive EBV VCA IgG, and positive EBV NA IgG.  This was consistent with past infection.  His PCP placed an urgent referral and advised patient to go to the ED for his EBV infection.  However, patient tried to gain clarification on why he needed to go to the ED or why he was referred to ID.  He was still advised to go to the ED so called our office on 7/24 stating he felt "great" and had improved following his course of antibiotics.  He still is feeling well.  Really has no issues at all today. He is sexually active with his wife and is monogamous.  He reports prior HIV testing that was negative.  Patient's Medications  New Prescriptions   No medications on file  Previous Medications   ATORVASTATIN (LIPITOR) 20 MG TABLET    Take 1 tablet (20 mg total) by mouth daily.   DESCOVY 200-25 MG TABLET    Take 1 tablet by mouth daily.  Modified Medications   No medications on file  Discontinued Medications   NYSTATIN (MYCOSTATIN) 100000 UNIT/ML SUSPENSION    Take 5 mLs (500,000 Units total) by mouth 4 (four) times daily.   ONDANSETRON (ZOFRAN) 4 MG TABLET    Take 1 tablet (4 mg total) by mouth every 8 (eight) hours as needed for nausea or vomiting.      Past Medical  History:  Diagnosis Date   Allergy    Phreesia 11/02/2020   Depression    [employer]reesia 11/02/2020   Elevated ALT measurement 11/28/2018   Mixed hyperlipidemia 11/26/2018    Social History   Tobacco Use   Smoking status: Never    Passive exposure: Never   Smokeless tobacco: Never  Vaping Use   Vaping Use: Never used  Substance Use Topics   Alcohol use: Not Currently   Drug use: No    Family History  Problem Relation Age of Onset   Depression Mother    Bipolar disorder Father    Schizophrenia Father    Thyroid disease Paternal Grandmother     Allergies  Allergen Reactions   Apple Rash    Itchy throat   Proanthocyanidin Itching   Banana     Itchy throat   Eggs-Apples-Oats [Alimentum]    Other     Cats and bunnies    Review of Systems  Constitutional: Negative.   HENT: Negative.    Respiratory: Negative.    Cardiovascular: Negative.       OBJECTIVE:    Vitals:   02/16/22 1350  Weight: (!) 350 lb (158.8 kg)  Height: 6\' 4"  (1.93 m)     Body mass index is  42.6 kg/m.  Physical Exam Constitutional:      General: He is not in acute distress.    Appearance: Normal appearance.  HENT:     Head: Normocephalic and atraumatic.  Pulmonary:     Effort: Pulmonary effort is normal. No respiratory distress.  Skin:    General: Skin is warm and dry.     Findings: No rash.  Neurological:     General: No focal deficit present.     Mental Status: He is alert and oriented to person, place, and time.  Psychiatric:        Mood and Affect: Mood normal.        Behavior: Behavior normal.      Labs and Microbiology:     Latest Ref Rng & Units 12/07/2021    9:37 AM 08/24/2020   11:31 AM 10/16/2019    3:59 PM  CBC  WBC 3.4 - 10.8 x10E3/uL 6.2  6.3  7.4   Hemoglobin 13.0 - 17.7 g/dL 16.1  09.6  04.5   Hematocrit 37.5 - 51.0 % 48.6  48.0  50.3   Platelets 150 - 450 x10E3/uL 349  291  307       Latest Ref Rng & Units 12/07/2021    9:37 AM 12/07/2020   11:28 AM 08/24/2020    11:31 AM  CMP  Glucose 70 - 99 mg/dL 80   87   BUN 6 - 20 mg/dL 7   8   Creatinine 4.09 - 1.27 mg/dL 8.11   9.14   Sodium 782 - 144 mmol/L 139   138   Potassium 3.5 - 5.2 mmol/L 4.6   4.2   Chloride 96 - 106 mmol/L 102   102   CO2 20 - 29 mmol/L 20   21   Calcium 8.7 - 10.2 mg/dL 9.1   9.3   Total Protein 6.0 - 8.5 g/dL  7.3  7.2   Total Bilirubin 0.0 - 1.2 mg/dL  0.3  0.4   Alkaline Phos 44 - 121 IU/L  98  71   AST 0 - 40 IU/L  28  37   ALT 0 - 44 IU/L  57  61      No results found for this or any previous visit (from the past 240 hour(s)).   ASSESSMENT & PLAN:    EBV infection  Patients labs show he had prior infection with EBV which is quite common and nothing for him to worry about.  He has improved from his initial illness that prompted his urgent care visit following a short course of Augmentin.  For completion, will check HIV screen.  This illness occurred over 3 weeks ago now so he is outside window period and if this was acute HIV (which I do not suspect) then his 4th generation screen will be positive so will not do a separate RNA today.    Orders Placed This Encounter  Procedures   HIV Antibody (routine testing w rflx)       Vedia Coffer for Infectious [facility] 02/16/2022, 1:53 PM

## 2022-02-16 NOTE — Assessment & Plan Note (Addendum)
  Patients labs show he had prior infection with EBV which is quite common and nothing for him to worry about.  He has improved from his initial illness that prompted his urgent care visit following a short course of Augmentin.  For completion, will check HIV screen.  This illness occurred over 3 weeks ago now so he is outside window period and if this was acute HIV (which I do not suspect) then his 4th generation screen will be positive so will not do a separate RNA today.

## 2022-02-19 LAB — HIV ANTIBODY (ROUTINE TESTING W REFLEX): HIV 1&2 Ab, 4th Generation: NONREACTIVE

## 2022-02-22 DIAGNOSIS — F329 Major depressive disorder, single episode, unspecified: Secondary | ICD-10-CM | POA: Diagnosis not present

## 2022-02-22 DIAGNOSIS — F411 Generalized anxiety disorder: Secondary | ICD-10-CM | POA: Diagnosis not present

## 2022-02-22 DIAGNOSIS — F4310 Post-traumatic stress disorder, unspecified: Secondary | ICD-10-CM | POA: Diagnosis not present

## 2022-03-02 DIAGNOSIS — Z713 Dietary counseling and surveillance: Secondary | ICD-10-CM | POA: Diagnosis not present

## 2022-03-04 ENCOUNTER — Other Ambulatory Visit: Payer: Self-pay | Admitting: Family

## 2022-03-04 DIAGNOSIS — E782 Mixed hyperlipidemia: Secondary | ICD-10-CM

## 2022-03-05 ENCOUNTER — Other Ambulatory Visit (INDEPENDENT_AMBULATORY_CARE_PROVIDER_SITE_OTHER): Payer: BC Managed Care – PPO

## 2022-03-05 ENCOUNTER — Other Ambulatory Visit: Payer: Self-pay

## 2022-03-05 DIAGNOSIS — Z79899 Other long term (current) drug therapy: Secondary | ICD-10-CM

## 2022-03-05 DIAGNOSIS — E782 Mixed hyperlipidemia: Secondary | ICD-10-CM | POA: Diagnosis not present

## 2022-03-05 NOTE — Progress Notes (Signed)
Lipid panel collected by Radiology tech [facility]

## 2022-03-05 NOTE — Telephone Encounter (Signed)
Requested Prescriptions  Pending Prescriptions Disp Refills  . atorvastatin (LIPITOR) 20 MG tablet [Pharmacy Med Name: ATORVASTATIN 20 MG TABLET] 90 tablet 0    Sig: TAKE 1 TABLET BY MOUTH EVERY DAY     Cardiovascular:  Antilipid - Statins Failed - 03/04/2022 12:17 PM      Failed - Lipid Panel in normal range within the last 12 months    Cholesterol, Total  Date Value Ref Range Status  12/14/2021 178 100 - 199 mg/dL Final   LDL Chol Calc (NIH)  Date Value Ref Range Status  12/14/2021 109 (H) 0 - 99 mg/dL Final   HDL  Date Value Ref Range Status  12/14/2021 37 (L) >39 mg/dL Final   Triglycerides  Date Value Ref Range Status  12/14/2021 181 (H) 0 - 149 mg/dL Final         Passed - Patient is not pregnant      Passed - Valid encounter within last 12 months    Recent Outpatient Visits          1 month ago Earache   Primary Care at [facility], Amy J, NP   1 month ago Tinnitus of left ear   Primary Care at [facility], Amy J, NP   2 months ago Annual physical exam   Primary Care at [facility], Amy J, NP   9 months ago Fever, unspecified   Primary Care at [facility], Gildardo Pounds, NP   1 year ago Annual physical exam   Primary Care at [facility], Salomon Fick, NP

## 2022-03-06 LAB — LIPID PANEL
Chol/HDL Ratio: 4 ratio (ref 0.0–5.0)
Cholesterol, Total: 156 mg/dL (ref 100–199)
HDL: 39 mg/dL — ABNORMAL LOW (ref >39)
LDL Chol Calc (NIH): 89 mg/dL (ref 0–99)
Triglycerides: 159 mg/dL — ABNORMAL HIGH (ref 0–149)
VLDL Cholesterol Cal: 28 mg/dL (ref 5–40)

## 2022-03-06 LAB — SPECIMEN STATUS REPORT

## 2022-03-07 ENCOUNTER — Other Ambulatory Visit: Payer: BC Managed Care – PPO

## 2022-03-16 DIAGNOSIS — Z713 Dietary counseling and surveillance: Secondary | ICD-10-CM | POA: Diagnosis not present

## 2022-03-22 DIAGNOSIS — F411 Generalized anxiety disorder: Secondary | ICD-10-CM | POA: Diagnosis not present

## 2022-03-22 DIAGNOSIS — F4310 Post-traumatic stress disorder, unspecified: Secondary | ICD-10-CM | POA: Diagnosis not present

## 2022-03-22 DIAGNOSIS — F329 Major depressive disorder, single episode, unspecified: Secondary | ICD-10-CM | POA: Diagnosis not present

## 2022-03-30 DIAGNOSIS — Z713 Dietary counseling and surveillance: Secondary | ICD-10-CM | POA: Diagnosis not present

## 2022-04-23 DIAGNOSIS — F329 Major depressive disorder, single episode, unspecified: Secondary | ICD-10-CM | POA: Diagnosis not present

## 2022-04-23 DIAGNOSIS — F411 Generalized anxiety disorder: Secondary | ICD-10-CM | POA: Diagnosis not present

## 2022-04-23 DIAGNOSIS — F4310 Post-traumatic stress disorder, unspecified: Secondary | ICD-10-CM | POA: Diagnosis not present

## 2022-04-27 DIAGNOSIS — Z713 Dietary counseling and surveillance: Secondary | ICD-10-CM | POA: Diagnosis not present

## 2022-04-30 DIAGNOSIS — K760 Fatty (change of) liver, not elsewhere classified: Secondary | ICD-10-CM | POA: Diagnosis not present

## 2022-05-09 DIAGNOSIS — F4310 Post-traumatic stress disorder, unspecified: Secondary | ICD-10-CM | POA: Diagnosis not present

## 2022-05-09 DIAGNOSIS — F411 Generalized anxiety disorder: Secondary | ICD-10-CM | POA: Diagnosis not present

## 2022-05-09 DIAGNOSIS — F329 Major depressive disorder, single episode, unspecified: Secondary | ICD-10-CM | POA: Diagnosis not present

## 2022-05-18 DIAGNOSIS — Z23 Encounter for immunization: Secondary | ICD-10-CM | POA: Diagnosis not present

## 2022-05-18 DIAGNOSIS — Z713 Dietary counseling and surveillance: Secondary | ICD-10-CM | POA: Diagnosis not present

## 2022-05-21 ENCOUNTER — Encounter: Payer: Self-pay | Admitting: Family

## 2022-05-22 ENCOUNTER — Other Ambulatory Visit: Payer: Self-pay | Admitting: Family

## 2022-05-22 DIAGNOSIS — Z131 Encounter for screening for diabetes mellitus: Secondary | ICD-10-CM

## 2022-05-22 DIAGNOSIS — Z13228 Encounter for screening for other metabolic disorders: Secondary | ICD-10-CM

## 2022-05-22 DIAGNOSIS — Z13220 Encounter for screening for lipoid disorders: Secondary | ICD-10-CM

## 2022-05-22 NOTE — Telephone Encounter (Signed)
Order complete. 

## 2022-05-24 ENCOUNTER — Other Ambulatory Visit: Payer: Self-pay

## 2022-05-24 ENCOUNTER — Other Ambulatory Visit: Payer: BC Managed Care – PPO

## 2022-05-24 DIAGNOSIS — Z131 Encounter for screening for diabetes mellitus: Secondary | ICD-10-CM | POA: Diagnosis not present

## 2022-05-24 DIAGNOSIS — Z13220 Encounter for screening for lipoid disorders: Secondary | ICD-10-CM | POA: Diagnosis not present

## 2022-05-24 DIAGNOSIS — Z13228 Encounter for screening for other metabolic disorders: Secondary | ICD-10-CM

## 2022-05-25 ENCOUNTER — Other Ambulatory Visit: Payer: Self-pay | Admitting: Family

## 2022-05-25 ENCOUNTER — Encounter: Payer: Self-pay | Admitting: Family

## 2022-05-25 DIAGNOSIS — R748 Abnormal levels of other serum enzymes: Secondary | ICD-10-CM

## 2022-05-25 DIAGNOSIS — R7303 Prediabetes: Secondary | ICD-10-CM

## 2022-05-25 DIAGNOSIS — E875 Hyperkalemia: Secondary | ICD-10-CM

## 2022-05-25 LAB — CMP14+EGFR
ALT: 51 IU/L — ABNORMAL HIGH (ref 0–44)
AST: 36 IU/L (ref 0–40)
Albumin/Globulin Ratio: 1.6 (ref 1.2–2.2)
Albumin: 4.3 g/dL (ref 4.3–5.2)
Alkaline Phosphatase: 87 IU/L (ref 44–121)
BUN/Creatinine Ratio: 9 (ref 9–20)
BUN: 9 mg/dL (ref 6–20)
Bilirubin Total: 0.4 mg/dL (ref 0.0–1.2)
CO2: 18 mmol/L — ABNORMAL LOW (ref 20–29)
Calcium: 9.4 mg/dL (ref 8.7–10.2)
Chloride: 102 mmol/L (ref 96–106)
Creatinine, Ser: 0.95 mg/dL (ref 0.76–1.27)
Globulin, Total: 2.7 g/dL (ref 1.5–4.5)
Glucose: 90 mg/dL (ref 70–99)
Potassium: 5.7 mmol/L — ABNORMAL HIGH (ref 3.5–5.2)
Sodium: 140 mmol/L (ref 134–144)
Total Protein: 7 g/dL (ref 6.0–8.5)
eGFR: 113 mL/min/{1.73_m2} (ref >59)

## 2022-05-25 LAB — LIPID PANEL
Chol/HDL Ratio: 3.7 ratio (ref 0.0–5.0)
Cholesterol, Total: 152 mg/dL (ref 100–199)
HDL: 41 mg/dL (ref >39)
LDL Chol Calc (NIH): 85 mg/dL (ref 0–99)
Triglycerides: 146 mg/dL (ref 0–149)
VLDL Cholesterol Cal: 26 mg/dL (ref 5–40)

## 2022-05-25 LAB — HEMOGLOBIN A1C
Est. average glucose Bld gHb Est-mCnc: 117 mg/dL
Hgb A1c MFr Bld: 5.7 % — ABNORMAL HIGH (ref 4.8–5.6)

## 2022-05-30 DIAGNOSIS — F4310 Post-traumatic stress disorder, unspecified: Secondary | ICD-10-CM | POA: Diagnosis not present

## 2022-05-30 DIAGNOSIS — F329 Major depressive disorder, single episode, unspecified: Secondary | ICD-10-CM | POA: Diagnosis not present

## 2022-05-30 DIAGNOSIS — F411 Generalized anxiety disorder: Secondary | ICD-10-CM | POA: Diagnosis not present

## 2022-05-31 ENCOUNTER — Other Ambulatory Visit: Payer: Self-pay | Admitting: Family

## 2022-05-31 DIAGNOSIS — E782 Mixed hyperlipidemia: Secondary | ICD-10-CM

## 2022-05-31 NOTE — Telephone Encounter (Signed)
Requested Prescriptions  Pending Prescriptions Disp Refills   atorvastatin (LIPITOR) 20 MG tablet [Pharmacy Med Name: ATORVASTATIN 20 MG TABLET] 90 tablet 2    Sig: TAKE 1 TABLET BY MOUTH EVERY DAY     Cardiovascular:  Antilipid - Statins Failed - 05/31/2022  2:34 AM      Failed - Lipid Panel in normal range within the last 12 months    Cholesterol, Total  Date Value Ref Range Status  05/24/2022 152 100 - 199 mg/dL Final   LDL Chol Calc (NIH)  Date Value Ref Range Status  05/24/2022 85 0 - 99 mg/dL Final   HDL  Date Value Ref Range Status  05/24/2022 41 >39 mg/dL Final   Triglycerides  Date Value Ref Range Status  05/24/2022 146 0 - 149 mg/dL Final         Passed - Patient is not pregnant      Passed - Valid encounter within last 12 months    Recent Outpatient Visits           4 months ago Earache   Primary Care at [facility], Amy J, NP   4 months ago Tinnitus of left ear   Primary Care at [facility], Amy J, NP   5 months ago Annual physical exam   Primary Care at [facility], Amy J, NP   1 year ago Fever, unspecified   Primary Care at [facility], Gildardo Pounds, NP   1 year ago Annual physical exam   Primary Care at [facility], Salomon Fick, NP

## 2022-06-01 DIAGNOSIS — Z713 Dietary counseling and surveillance: Secondary | ICD-10-CM | POA: Diagnosis not present

## 2022-06-20 DIAGNOSIS — F4310 Post-traumatic stress disorder, unspecified: Secondary | ICD-10-CM | POA: Diagnosis not present

## 2022-06-20 DIAGNOSIS — F411 Generalized anxiety disorder: Secondary | ICD-10-CM | POA: Diagnosis not present

## 2022-06-20 DIAGNOSIS — Z713 Dietary counseling and surveillance: Secondary | ICD-10-CM | POA: Diagnosis not present

## 2022-06-20 DIAGNOSIS — F329 Major depressive disorder, single episode, unspecified: Secondary | ICD-10-CM | POA: Diagnosis not present

## 2022-06-22 DIAGNOSIS — K760 Fatty (change of) liver, not elsewhere classified: Secondary | ICD-10-CM | POA: Diagnosis not present

## 2022-06-22 DIAGNOSIS — Z23 Encounter for immunization: Secondary | ICD-10-CM | POA: Diagnosis not present

## 2022-07-06 DIAGNOSIS — Z713 Dietary counseling and surveillance: Secondary | ICD-10-CM | POA: Diagnosis not present

## 2022-07-24 DIAGNOSIS — F4310 Post-traumatic stress disorder, unspecified: Secondary | ICD-10-CM | POA: Diagnosis not present

## 2022-07-24 DIAGNOSIS — F411 Generalized anxiety disorder: Secondary | ICD-10-CM | POA: Diagnosis not present

## 2022-07-24 DIAGNOSIS — F329 Major depressive disorder, single episode, unspecified: Secondary | ICD-10-CM | POA: Diagnosis not present

## 2022-07-27 DIAGNOSIS — Z713 Dietary counseling and surveillance: Secondary | ICD-10-CM | POA: Diagnosis not present

## 2022-08-13 DIAGNOSIS — F4310 Post-traumatic stress disorder, unspecified: Secondary | ICD-10-CM | POA: Diagnosis not present

## 2022-08-13 DIAGNOSIS — F329 Major depressive disorder, single episode, unspecified: Secondary | ICD-10-CM | POA: Diagnosis not present

## 2022-08-13 DIAGNOSIS — F411 Generalized anxiety disorder: Secondary | ICD-10-CM | POA: Diagnosis not present

## 2022-08-14 NOTE — Progress Notes (Signed)
Patient ID: Jonathan Kaufman, male    DOB: March 20, 1996  MRN: [id number]  CC: Labs  Subjective: Jonathan Kaufman is a 27 y.o. male who presents for labs.   His concerns today include:  Patient presents today for labs (CMP, CBC, lipid, and A1C) per request of his nutritionist Sheppard Plumber, RDN, LDN at [place]. He is doing well on Atorvastatin, no issues/concerns. States he did not receive call from Gastroenterology for evaluation of elevated liver enzymes. No further issues/concerns today.  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     Omega-3 Fatty Acids (FISH OIL) 1200 MG CAPS Take 1,200 mg by mouth 2 (two) times daily.     No current facility-administered medications on file prior to visit.    Allergies  Allergen Reactions   Apple Rash    Itchy throat   Proanthocyanidin Itching   Banana     Itchy throat   Eggs-Apples-Oats [Alimentum]    Other     Cats and bunnies    Social History   Socioeconomic History   Marital status: Married    Spouse name: Not on file   Number of children: Not on file   Years of education: Not on file   Highest education level: Not on file  Occupational History   Not on file  Tobacco Use   Smoking status: Never    Passive exposure: Never   Smokeless tobacco: Never  Vaping Use   Vaping Use: Never used  Substance and Sexual Activity   Alcohol use: Not Currently   Drug use: No    Sexual activity: Not on file  Other Topics Concern   Not on file  Social History Narrative   Not on file   Social Determinants of Health   Financial Resource Strain: Not on file  Food Insecurity: Not on file  Transportation Needs: Not on file  Physical Activity: Not on file  Stress: Not on file  Social Connections: Not on file  Intimate Partner Violence: Not on file    Family History  Problem Relation Age of Onset   Depression Mother    Bipolar disorder Father    Schizophrenia Father    Thyroid disease Paternal Grandmother     Past Surgical History:  Procedure Laterality Date   MOUTH SURGERY      ROS: Review of Systems Negative except as stated above  PHYSICAL EXAM: BP 119/81 (BP Location: Left Arm, Patient Position: Sitting, Cuff Size: Large)   Pulse 85   Temp 98.3 F (36.8 C)   Resp 16   Ht 6' 3.98" (1.93 m)   Wt (!) 362 lb (164.2 kg)   SpO2 95%   BMI 44.08 kg/m   Physical Exam HENT:     Head: Normocephalic and atraumatic.  Eyes:     Extraocular Movements: Extraocular movements intact.     Conjunctiva/sclera: Conjunctivae normal.     Pupils:  Pupils are equal, round, and reactive to light.  Cardiovascular:     Rate and Rhythm: Normal rate and regular rhythm.     Pulses: Normal pulses.     Heart sounds: Normal heart sounds.  Pulmonary:     Effort: Pulmonary effort is normal.     Breath sounds: Normal breath sounds.  Musculoskeletal:     Cervical back: Normal range of motion and neck supple.  Neurological:     General: No focal deficit present.     Mental Status: He is alert and oriented to person, place, and time.  Psychiatric:        Mood and Affect: Mood normal.        Behavior: Behavior normal.     ASSESSMENT AND PLAN: 1. Screening for metabolic disorder - Routine screening.  - CMP14+EGFR  2. Screening for deficiency anemia - Routine screening.  - CBC  3. Prediabetes - Routine screening.  - Hemoglobin A1c  4. Mixed  hyperlipidemia - Routine screening. Will update medication regimen once lab results. - Lipid panel   Patient was given the opportunity to ask questions.  Patient verbalized understanding of the plan and was able to repeat key elements of the plan. Patient was given clear instructions to go to Emergency Department or return to medical center if symptoms don't improve, worsen, or new problems develop.The patient verbalized understanding.   Orders Placed This Encounter  Procedures   CBC   Lipid panel   CMP14+EGFR   Hemoglobin A1c    Follow-up with primary provider as scheduled.   Camillia Herter, NP

## 2022-08-15 ENCOUNTER — Encounter: Payer: Self-pay | Admitting: Family

## 2022-08-15 ENCOUNTER — Ambulatory Visit (INDEPENDENT_AMBULATORY_CARE_PROVIDER_SITE_OTHER): Payer: BC Managed Care – PPO | Admitting: Family

## 2022-08-15 ENCOUNTER — Other Ambulatory Visit: Payer: BC Managed Care – PPO

## 2022-08-15 VITALS — BP 119/81 | HR 85 | Temp 98.3°F | Resp 16 | Ht 75.98 in | Wt 362.0 lb

## 2022-08-15 DIAGNOSIS — Z13228 Encounter for screening for other metabolic disorders: Secondary | ICD-10-CM

## 2022-08-15 DIAGNOSIS — R7303 Prediabetes: Secondary | ICD-10-CM | POA: Diagnosis not present

## 2022-08-15 DIAGNOSIS — E782 Mixed hyperlipidemia: Secondary | ICD-10-CM

## 2022-08-15 DIAGNOSIS — Z130 Encounter for screening for diseases of the blood and blood-forming organs and certain disorders involving the immune mechanism: Secondary | ICD-10-CM | POA: Diagnosis not present

## 2022-08-15 NOTE — Progress Notes (Signed)
Pt presents for labs requested by nutritionist Sheppard Plumber on 07/30/22, by letter on pt phone

## 2022-08-16 ENCOUNTER — Other Ambulatory Visit: Payer: Self-pay | Admitting: Family

## 2022-08-16 DIAGNOSIS — R748 Abnormal levels of other serum enzymes: Secondary | ICD-10-CM

## 2022-08-16 DIAGNOSIS — E785 Hyperlipidemia, unspecified: Secondary | ICD-10-CM

## 2022-08-16 LAB — CBC
Hematocrit: 49.1 % (ref 37.5–51.0)
Hemoglobin: 16.1 g/dL (ref 13.0–17.7)
MCH: 29.1 pg (ref 26.6–33.0)
MCHC: 32.8 g/dL (ref 31.5–35.7)
MCV: 89 fL (ref 79–97)
Platelets: 291 10*3/uL (ref 150–450)
RBC: 5.53 x10E6/uL (ref 4.14–5.80)
RDW: 12.6 % (ref 11.6–15.4)
WBC: 7.1 10*3/uL (ref 3.4–10.8)

## 2022-08-16 LAB — CMP14+EGFR
ALT: 54 IU/L — ABNORMAL HIGH (ref 0–44)
AST: 23 IU/L (ref 0–40)
Albumin/Globulin Ratio: 1.9 (ref 1.2–2.2)
Albumin: 4.6 g/dL (ref 4.3–5.2)
Alkaline Phosphatase: 82 IU/L (ref 44–121)
BUN/Creatinine Ratio: 12 (ref 9–20)
BUN: 10 mg/dL (ref 6–20)
Bilirubin Total: 0.5 mg/dL (ref 0.0–1.2)
CO2: 20 mmol/L (ref 20–29)
Calcium: 9.1 mg/dL (ref 8.7–10.2)
Chloride: 105 mmol/L (ref 96–106)
Creatinine, Ser: 0.86 mg/dL (ref 0.76–1.27)
Globulin, Total: 2.4 g/dL (ref 1.5–4.5)
Glucose: 83 mg/dL (ref 70–99)
Potassium: 4.4 mmol/L (ref 3.5–5.2)
Sodium: 140 mmol/L (ref 134–144)
Total Protein: 7 g/dL (ref 6.0–8.5)
eGFR: 122 mL/min/{1.73_m2} (ref >59)

## 2022-08-16 LAB — LIPID PANEL
Chol/HDL Ratio: 4.7 ratio (ref 0.0–5.0)
Cholesterol, Total: 174 mg/dL (ref 100–199)
HDL: 37 mg/dL — ABNORMAL LOW (ref >39)
LDL Chol Calc (NIH): 102 mg/dL — ABNORMAL HIGH (ref 0–99)
Triglycerides: 200 mg/dL — ABNORMAL HIGH (ref 0–149)
VLDL Cholesterol Cal: 35 mg/dL (ref 5–40)

## 2022-08-16 LAB — HEMOGLOBIN A1C
Est. average glucose Bld gHb Est-mCnc: 126 mg/dL
Hgb A1c MFr Bld: 6 % — ABNORMAL HIGH (ref 4.8–5.6)

## 2022-08-16 MED ORDER — ATORVASTATIN CALCIUM 20 MG PO TABS: 20.0000 mg | ORAL_TABLET | Freq: Every day | ORAL | 2 refills | Status: DC

## 2022-08-17 DIAGNOSIS — Z713 Dietary counseling and surveillance: Secondary | ICD-10-CM | POA: Diagnosis not present

## 2022-08-31 ENCOUNTER — Encounter: Payer: Self-pay | Admitting: Physician Assistant

## 2022-09-14 DIAGNOSIS — Z713 Dietary counseling and surveillance: Secondary | ICD-10-CM | POA: Diagnosis not present

## 2022-09-16 ENCOUNTER — Other Ambulatory Visit: Payer: Self-pay | Admitting: Family

## 2022-09-16 DIAGNOSIS — E785 Hyperlipidemia, unspecified: Secondary | ICD-10-CM

## 2022-09-18 DIAGNOSIS — F411 Generalized anxiety disorder: Secondary | ICD-10-CM | POA: Diagnosis not present

## 2022-09-18 DIAGNOSIS — F4310 Post-traumatic stress disorder, unspecified: Secondary | ICD-10-CM | POA: Diagnosis not present

## 2022-09-23 DIAGNOSIS — J039 Acute tonsillitis, unspecified: Secondary | ICD-10-CM | POA: Diagnosis not present

## 2022-09-25 ENCOUNTER — Encounter: Payer: Self-pay | Admitting: Physician Assistant

## 2022-10-03 ENCOUNTER — Ambulatory Visit: Payer: BC Managed Care – PPO | Admitting: Physician Assistant

## 2022-10-09 DIAGNOSIS — F411 Generalized anxiety disorder: Secondary | ICD-10-CM | POA: Diagnosis not present

## 2022-10-09 DIAGNOSIS — J029 Acute pharyngitis, unspecified: Secondary | ICD-10-CM | POA: Diagnosis not present

## 2022-10-12 DIAGNOSIS — Z713 Dietary counseling and surveillance: Secondary | ICD-10-CM | POA: Diagnosis not present

## 2022-10-26 DIAGNOSIS — Z713 Dietary counseling and surveillance: Secondary | ICD-10-CM | POA: Diagnosis not present

## 2022-11-06 DIAGNOSIS — F411 Generalized anxiety disorder: Secondary | ICD-10-CM | POA: Diagnosis not present

## 2022-11-06 DIAGNOSIS — F4310 Post-traumatic stress disorder, unspecified: Secondary | ICD-10-CM | POA: Diagnosis not present

## 2022-11-06 DIAGNOSIS — F329 Major depressive disorder, single episode, unspecified: Secondary | ICD-10-CM | POA: Diagnosis not present

## 2022-11-13 ENCOUNTER — Encounter: Payer: Self-pay | Admitting: Family

## 2022-11-14 DIAGNOSIS — Z713 Dietary counseling and surveillance: Secondary | ICD-10-CM | POA: Diagnosis not present

## 2022-11-16 ENCOUNTER — Ambulatory Visit: Payer: BC Managed Care – PPO | Admitting: Physician Assistant

## 2022-11-16 ENCOUNTER — Encounter: Payer: Self-pay | Admitting: Physician Assistant

## 2022-11-16 VITALS — BP 124/72 | HR 81 | Ht 76.0 in | Wt 362.2 lb

## 2022-11-16 DIAGNOSIS — R7401 Elevation of levels of liver transaminase levels: Secondary | ICD-10-CM | POA: Diagnosis not present

## 2022-11-16 DIAGNOSIS — K760 Fatty (change of) liver, not elsewhere classified: Secondary | ICD-10-CM

## 2022-11-16 NOTE — Progress Notes (Signed)
Addendum: Reviewed and agree with assessment and management plan. Raneen Jaffer M, MD  

## 2022-11-16 NOTE — Patient Instructions (Signed)
_______________________________________________________  If your blood pressure at your visit was 140/90 or greater, please contact your primary care physician to follow up on this.  If you are age 27 or younger, your body mass index should be between 19-25. Your Body mass index is 44.09 kg/m. If this is out of the aformentioned range listed, please consider follow up with your Primary Care Provider.  ________________________________________________________  The [facility] providers would like to encourage you to use [facility] to communicate with providers for non-urgent requests or questions.  Due to long hold times on the telephone, sending your provider a message by [facility] may be a faster and more efficient way to get a response.  Please allow 48 business hours for a response.  Please remember that this is for non-urgent requests.  _______________________________________________________  Jonathan Kaufman will follow up in our office on an as needed basis.  Thank you for entrusting me with your care and choosing [facility].  Hyacinth Meeker, PA-C

## 2022-11-16 NOTE — Progress Notes (Signed)
Chief Complaint: Elevated LFT's  HPI:    Jonathan Kaufman is a 27 y/o Caucasian male with a past medical history as listed below, who was referred to me by Rema Fendt, NP for a complaint of elevated LFTs.      02/14/2021 abdominal ultrasound for elevation of elevated ALT with coarse echotexture of the hepatic parenchyma most commonly seen with steatosis.    08/15/2022 CMP with an ALT minimally elevated at 54 (05/24/2022-51, 12/07/2021-57, 2/22-61, 10/16/2019-42) other liver enzymes normal.  Lipid panel with triglycerides elevated 200, HDL low at 37, LDL elevated at 102.  CBC normal.  Hemoglobin A1c elevated at 6.    Today, the patient presents to clinic and tells me that he is doing well.  He really has no GI symptoms unless he eats an offending food which then can give him some epigastric pain but can always pinpoint what it was.  He tells me that in general he is feeling well.  He has been working with a nutritionist over the past year who is working on healthy changes in his diet.  They really do not focus on weight loss.  He feels like he is making good progress there and has been working with her for almost a year.    Denies fever, chills, weight loss, history of blood transfusions, IV drug use, alcohol abuse or family history of liver disease.  Past Medical History:  Diagnosis Date   Allergy    Phreesia 11/02/2020   Depression    Phreesia 11/02/2020   Elevated ALT measurement 11/28/2018   Mixed hyperlipidemia 11/26/2018    Past Surgical History:  Procedure Laterality Date   MOUTH SURGERY      Current Outpatient Medications  Medication Sig Dispense Refill   atorvastatin (LIPITOR) 20 MG tablet TAKE 1 TABLET BY MOUTH EVERY DAY 90 tablet 0   Coenzyme Q10 (COQ-10) 30 MG CAPS Take by mouth.     DESCOVY 200-25 MG tablet Take 1 tablet by mouth daily.     Loratadine (CLARITIN) 10 MG CAPS Take by mouth.     Omega-3 Fatty Acids (FISH OIL) 1200 MG CAPS Take 1,200 mg by mouth 2 (two) times daily.      No current facility-administered medications for this visit.    Allergies as of 11/16/2022 - Review Complete 11/16/2022  Allergen Reaction Noted   Apple Rash 01/16/2021   Proanthocyanidin Itching 01/16/2021   Banana  03/06/2017   Eggs-apples-oats [alimentum]  12/02/2020   Other  03/06/2017    Family History  Problem Relation Age of Onset   Depression Mother    Bipolar disorder Father    Schizophrenia Father    Thyroid disease Paternal Grandmother    Colon cancer Neg Hx    Esophageal cancer Neg Hx     Social History   Socioeconomic History   Marital status: Married    Spouse name: Not on file   Number of children: Not on file   Years of education: Not on file   Highest education level: Not on file  Occupational History   Not on file  Tobacco Use   Smoking status: Never    Passive exposure: Never   Smokeless tobacco: Never  Vaping Use   Vaping Use: Never used  Substance and Sexual Activity   Alcohol use: Not Currently   Drug use: No   Sexual activity: Not on file  Other Topics Concern   Not on file  Social History Narrative   Not on file  Social Determinants of Health   Financial Resource Strain: Not on file  Food Insecurity: Not on file  Transportation Needs: Not on file  Physical Activity: Not on file  Stress: Not on file  Social Connections: Not on file  Intimate Partner Violence: Not on file    Review of Systems:    Constitutional: No weight loss, fever or chills Skin: No rash Cardiovascular: No chest pain  Respiratory: No SOB  Gastrointestinal: See HPI and otherwise negative Genitourinary: No dysuria Neurological: No headache, dizziness or syncope Musculoskeletal: No new muscle or joint pain Hematologic: No bleeding  Psychiatric: No history of depression or anxiety   Physical Exam:  Vital signs: BP 124/72   Pulse 81   Ht 6\' 4"  (1.93 m)   Wt (!) 362 lb 4 oz (164.3 kg)   BMI 44.09 kg/m    Constitutional:   Pleasant obese Caucasian  male appears to be in NAD, Well developed, Well nourished, alert and cooperative Head:  Normocephalic and atraumatic. Eyes:   PEERL, EOMI. No icterus. Conjunctiva pink. Ears:  Normal auditory acuity. Neck:  Supple Throat: Oral cavity and pharynx without inflammation, swelling or lesion.  Respiratory: Respirations even and unlabored. Lungs clear to auscultation bilaterally.   No wheezes, crackles, or rhonchi.  Cardiovascular: Normal S1, S2. No MRG. Regular rate and rhythm. No peripheral edema, cyanosis or pallor.  Gastrointestinal:  Soft, nondistended, nontender. No rebound or guarding. Normal bowel sounds. No appreciable masses or hepatomegaly. Rectal:  Not performed.  Msk:  Symmetrical without gross deformities. Without edema, no deformity or joint abnormality.  Neurologic:  Alert and  oriented x4;  grossly normal neurologically.  Skin:   Dry and intact without significant lesions or rashes. Psychiatric: Demonstrates good judgement and reason without abnormal affect or behaviors.  RELEVANT LABS AND IMAGING: CBC    Component Value Date/Time   WBC 7.1 08/15/2022 1028   WBC 5.8 03/06/2017 1401   RBC 5.53 08/15/2022 1028   RBC 5.61 03/06/2017 1401   HGB 16.1 08/15/2022 1028   HCT 49.1 08/15/2022 1028   PLT 291 08/15/2022 1028   MCV 89 08/15/2022 1028   MCH 29.1 08/15/2022 1028   MCH 29.8 03/06/2017 1401   MCHC 32.8 08/15/2022 1028   MCHC 34.2 03/06/2017 1401   RDW 12.6 08/15/2022 1028   LYMPHSABS 2.5 08/24/2020 1131   MONOABS 464 03/06/2017 1401   EOSABS 0.7 (H) 08/24/2020 1131   BASOSABS 0.0 08/24/2020 1131    CMP     Component Value Date/Time   NA 140 08/15/2022 1028   K 4.4 08/15/2022 1028   CL 105 08/15/2022 1028   CO2 20 08/15/2022 1028   GLUCOSE 83 08/15/2022 1028   GLUCOSE 84 03/06/2017 1401   BUN 10 08/15/2022 1028   CREATININE 0.86 08/15/2022 1028   CREATININE 0.83 03/06/2017 1401   CALCIUM 9.1 08/15/2022 1028   PROT 7.0 08/15/2022 1028   ALBUMIN 4.6  08/15/2022 1028   AST 23 08/15/2022 1028   ALT 54 (H) 08/15/2022 1028   ALKPHOS 82 08/15/2022 1028   BILITOT 0.5 08/15/2022 1028   GFRNONAA 122 08/24/2020 1131   GFRAA 141 08/24/2020 1131    Assessment: 1.  Elevated ALT: Minimal elevation over the past 3 years, also with elevated hemoglobin A1c and high cholesterol; metabolic syndrome and fatty liver 2.  Fatty liver: Seen at time of ultrasound in 2022  Plan: 1.  Discussed with patient that I am not concerned in regards to his minimally elevated ALT over  the past few years.  Especially since we have done an ultrasound that shows fatty liver.  This is the cause.  Patient has no red flags and no family history of liver disease.  I explained that unless his ALT increases dramatically or his other liver enzymes start to change or he has further symptoms then this does not need further workup.  He should continue to work with his nutritionist in regards to healthy diet changes and hopefully slow and steady weight loss of at least 1 to 2 pounds a week.  Discussed how this can reverse the process and normalize his liver enzymes.  He verbalized understanding. 2.  Patient to follow with [place] as needed in the future.  He was assigned to Dr. Rhea Kaufman this morning.  Hyacinth Meeker, PA-C [facility] 11/16/2022, 10:27 AM  Cc: Rema Fendt, NP

## 2022-11-28 DIAGNOSIS — Z713 Dietary counseling and surveillance: Secondary | ICD-10-CM | POA: Diagnosis not present

## 2022-11-28 NOTE — Progress Notes (Unsigned)
Patient ID: Jonathan Kaufman, male    DOB: 04-23-1996  MRN: [id number]  CC: Labs  Subjective: Jonathan Kaufman is a 27 y.o. male who presents for labs.   His concerns today include:  Patient presents today for labs request from [facility] where he is established with Jonathan Fake, MS, RDN, LDN. No further issues/concerns for discussion today.   Patient Active Problem List   Diagnosis Date Noted   Prediabetes 05/25/2022   EBV infection 02/16/2022   Sore throat 02/16/2022   NAFLD (nonalcoholic fatty liver disease) 40/98/1191   Low serum testosterone level 10/18/2019   Elevated ALT measurement 11/28/2018   Mixed hyperlipidemia 11/26/2018   Family history of thyroid disease 05/30/2018   Seasonal allergies 01/20/2015     Current Outpatient Medications on File Prior to Visit  Medication Sig Dispense Refill   atorvastatin (LIPITOR) 20 MG tablet TAKE 1 TABLET BY MOUTH EVERY DAY 90 tablet 0   Coenzyme Q10 (COQ-10) 30 MG CAPS Take by mouth.     DESCOVY 200-25 MG tablet Take 1 tablet by mouth daily.     Loratadine (CLARITIN) 10 MG CAPS Take by mouth.     Omega-3 Fatty Acids (FISH OIL) 1200 MG CAPS Take 1,200 mg by mouth 2 (two) times daily.     No current facility-administered medications on file prior to visit.    Allergies  Allergen Reactions   Apple Rash    Itchy throat   Proanthocyanidin Itching   Banana     Itchy throat   Eggs-Apples-Oats [Alimentum]    Other     Cats and bunnies    Social History   Socioeconomic History   Marital status: Married    Spouse name: Not on file   Number of children: Not on file   Years of education: Not on file   Highest education level: Bachelor's degree (e.g., BA, AB, BS)  Occupational History   Not on file  Tobacco Use   Smoking status: Never    Passive exposure: Never   Smokeless tobacco: Never  Vaping Use   Vaping Use: Never used  Substance and Sexual Activity   Alcohol use: Not Currently   Drug use: No   Sexual activity:  Not on file  Other Topics Concern   Not on file  Social History Narrative   Not on file   Social Determinants of Health   Financial Resource Strain: Low Risk  (11/28/2022)   Overall Financial Resource Strain (CARDIA)    Difficulty of Paying Living Expenses: Not hard at all  Food Insecurity: No Food Insecurity (11/28/2022)   Hunger Vital Sign    Worried About Running Out of Food in the Last Year: Never true    Ran Out of Food in the Last Year: Never true  Transportation Needs: No Transportation Needs (11/28/2022)   PRAPARE - Administrator, Civil Service (Medical): No    Lack of Transportation (Non-Medical): No  Physical Activity: Insufficiently Active (11/28/2022)   Exercise Vital Sign    Days of Exercise per Week: 3 days    Minutes of Exercise per Session: 20 min  Stress: No Stress Concern Present (11/28/2022)   [employer] of Occupational Health - Occupational Stress Questionnaire    Feeling of Stress : Not at all  Social Connections: Unknown (11/28/2022)   Social Connection and Isolation Panel [NHANES]    Frequency of Communication with Friends and Family: Three times a week    Frequency of Social Gatherings with  Friends and Family: Three times a week    Attends Religious Services: Patient declined    Active Member of Clubs or Organizations: Yes    Attends Engineer, structural: More than 4 times per year    Marital Status: Married  Catering manager Violence: Not on file    Family History  Problem Relation Age of Onset   Depression Mother    Bipolar disorder Father    Schizophrenia Father    Thyroid disease Paternal Grandmother    Colon cancer Neg Hx    Esophageal cancer Neg Hx     Past Surgical History:  Procedure Laterality Date   MOUTH SURGERY      ROS: Review of Systems Negative except as stated above  PHYSICAL EXAM: BP 114/76 (BP Location: Right Arm, Patient Position: Sitting, Cuff Size: Large)   Pulse 87   Resp 14   Ht 6\' 4"  (1.93 m)    Wt (!) 363 lb 9.6 oz (164.9 kg)   SpO2 98%   BMI 44.26 kg/m   Physical Exam HENT:     Head: Normocephalic and atraumatic.  Eyes:     Extraocular Movements: Extraocular movements intact.     Conjunctiva/sclera: Conjunctivae normal.     Pupils: Pupils are equal, round, and reactive to light.  Cardiovascular:     Rate and Rhythm: Normal rate and regular rhythm.     Pulses: Normal pulses.     Heart sounds: Normal heart sounds.  Pulmonary:     Effort: Pulmonary effort is normal.     Breath sounds: Normal breath sounds.  Musculoskeletal:     Cervical back: Normal range of motion and neck supple.  Neurological:     General: No focal deficit present.     Mental Status: He is alert and oriented to person, place, and time.  Psychiatric:        Mood and Affect: Mood normal.        Behavior: Behavior normal.    ASSESSMENT AND PLAN: 1. Screening for metabolic disorder - Routine screening.  - CMP14+EGFR  2. Screening for deficiency anemia - Routine screening.  - CBC  3. Diabetes mellitus screening - Routine screening.  - Hemoglobin A1c  4. Screening cholesterol level - Routine screening.  - Lipid panel   Patient was given the opportunity to ask questions.  Patient verbalized understanding of the plan and was able to repeat key elements of the plan. Patient was given clear instructions to go to Emergency Department or return to medical center if symptoms don't improve, worsen, or new problems develop.The patient verbalized understanding.   Orders Placed This Encounter  Procedures   CMP14+EGFR   CBC   Lipid panel   Hemoglobin A1c    Follow-up with primary provider as scheduled.   Rema Fendt, NP

## 2022-11-29 ENCOUNTER — Encounter: Payer: Self-pay | Admitting: Family

## 2022-11-29 ENCOUNTER — Ambulatory Visit (INDEPENDENT_AMBULATORY_CARE_PROVIDER_SITE_OTHER): Payer: BC Managed Care – PPO | Admitting: Family

## 2022-11-29 VITALS — BP 114/76 | HR 87 | Resp 14 | Ht 76.0 in | Wt 363.6 lb

## 2022-11-29 DIAGNOSIS — Z13228 Encounter for screening for other metabolic disorders: Secondary | ICD-10-CM

## 2022-11-29 DIAGNOSIS — Z131 Encounter for screening for diabetes mellitus: Secondary | ICD-10-CM

## 2022-11-29 DIAGNOSIS — Z130 Encounter for screening for diseases of the blood and blood-forming organs and certain disorders involving the immune mechanism: Secondary | ICD-10-CM

## 2022-11-29 DIAGNOSIS — Z13220 Encounter for screening for lipoid disorders: Secondary | ICD-10-CM

## 2022-11-29 NOTE — Progress Notes (Signed)
Pt is here requesting labs   Pt states his nutritionist is requesting that he have labs done

## 2022-11-30 LAB — LIPID PANEL
Chol/HDL Ratio: 4 ratio (ref 0.0–5.0)
Cholesterol, Total: 145 mg/dL (ref 100–199)
HDL: 36 mg/dL — ABNORMAL LOW (ref >39)
LDL Chol Calc (NIH): 86 mg/dL (ref 0–99)
Triglycerides: 129 mg/dL (ref 0–149)
VLDL Cholesterol Cal: 23 mg/dL (ref 5–40)

## 2022-11-30 LAB — CMP14+EGFR
ALT: 53 IU/L — ABNORMAL HIGH (ref 0–44)
AST: 23 IU/L (ref 0–40)
Albumin/Globulin Ratio: 1.8 (ref 1.2–2.2)
Albumin: 4.4 g/dL (ref 4.3–5.2)
Alkaline Phosphatase: 83 IU/L (ref 44–121)
BUN/Creatinine Ratio: 11 (ref 9–20)
BUN: 11 mg/dL (ref 6–20)
Bilirubin Total: 0.5 mg/dL (ref 0.0–1.2)
CO2: 20 mmol/L (ref 20–29)
Calcium: 9.3 mg/dL (ref 8.7–10.2)
Chloride: 104 mmol/L (ref 96–106)
Creatinine, Ser: 0.97 mg/dL (ref 0.76–1.27)
Globulin, Total: 2.5 g/dL (ref 1.5–4.5)
Glucose: 91 mg/dL (ref 70–99)
Potassium: 4.6 mmol/L (ref 3.5–5.2)
Sodium: 141 mmol/L (ref 134–144)
Total Protein: 6.9 g/dL (ref 6.0–8.5)
eGFR: 110 mL/min/{1.73_m2} (ref >59)

## 2022-11-30 LAB — CBC
Hematocrit: 48 % (ref 37.5–51.0)
Hemoglobin: 16.4 g/dL (ref 13.0–17.7)
MCH: 30.5 pg (ref 26.6–33.0)
MCHC: 34.2 g/dL (ref 31.5–35.7)
MCV: 89 fL (ref 79–97)
Platelets: 282 10*3/uL (ref 150–450)
RBC: 5.37 x10E6/uL (ref 4.14–5.80)
RDW: 12.7 % (ref 11.6–15.4)
WBC: 6.7 10*3/uL (ref 3.4–10.8)

## 2022-11-30 LAB — HEMOGLOBIN A1C
Est. average glucose Bld gHb Est-mCnc: 123 mg/dL
Hgb A1c MFr Bld: 5.9 % — ABNORMAL HIGH (ref 4.8–5.6)

## 2022-12-04 DIAGNOSIS — F411 Generalized anxiety disorder: Secondary | ICD-10-CM | POA: Diagnosis not present

## 2022-12-05 DIAGNOSIS — Z713 Dietary counseling and surveillance: Secondary | ICD-10-CM | POA: Diagnosis not present

## 2022-12-12 NOTE — Telephone Encounter (Signed)
Labs completed 11/29/22

## 2022-12-13 DIAGNOSIS — A519 Early syphilis, unspecified: Secondary | ICD-10-CM | POA: Diagnosis not present

## 2022-12-14 DIAGNOSIS — Z113 Encounter for screening for infections with a predominantly sexual mode of transmission: Secondary | ICD-10-CM | POA: Diagnosis not present

## 2022-12-14 DIAGNOSIS — Z202 Contact with and (suspected) exposure to infections with a predominantly sexual mode of transmission: Secondary | ICD-10-CM | POA: Diagnosis not present

## 2022-12-19 DIAGNOSIS — Z713 Dietary counseling and surveillance: Secondary | ICD-10-CM | POA: Diagnosis not present

## 2022-12-20 DIAGNOSIS — F411 Generalized anxiety disorder: Secondary | ICD-10-CM | POA: Diagnosis not present

## 2023-01-03 DIAGNOSIS — Z713 Dietary counseling and surveillance: Secondary | ICD-10-CM | POA: Diagnosis not present

## 2023-01-10 ENCOUNTER — Ambulatory Visit: Payer: BC Managed Care – PPO | Admitting: Family

## 2023-01-10 DIAGNOSIS — F411 Generalized anxiety disorder: Secondary | ICD-10-CM | POA: Diagnosis not present

## 2023-01-14 ENCOUNTER — Encounter: Payer: Self-pay | Admitting: Family

## 2023-01-16 ENCOUNTER — Ambulatory Visit (INDEPENDENT_AMBULATORY_CARE_PROVIDER_SITE_OTHER): Payer: BC Managed Care – PPO | Admitting: Family

## 2023-01-16 ENCOUNTER — Encounter: Payer: Self-pay | Admitting: Family

## 2023-01-16 VITALS — BP 117/79 | HR 81 | Temp 98.1°F | Ht 76.0 in | Wt 362.2 lb

## 2023-01-16 DIAGNOSIS — F40243 Fear of flying: Secondary | ICD-10-CM | POA: Diagnosis not present

## 2023-01-16 DIAGNOSIS — R4589 Other symptoms and signs involving emotional state: Secondary | ICD-10-CM

## 2023-01-16 NOTE — Progress Notes (Signed)
Patient ID: Jonathan Kaufman, male    DOB: January 18, 1996  MRN: [id number]  CC: Anxiety  Subjective: Jonathan Kaufman is a 27 y.o. male who presents for anxiety.  His concerns today include:  - Anxiety primarily related to health, driving, and flying. Reports has assistance with driving daily. Not ready for daily anxiety medication. Planning to take a trip to [place] from 02/27/2023 - 03/05/2023. States "ok" during the flight but does have increased anxiety with takeoff and landing. Has increased nausea during flight. Would like to trial anxiety medication and nausea medication for upcoming flight. Also, questions about which vaccinations are recommended to prepare for trip to [place]. I discussed with patient in detail that I will inform patient of recommended vaccinations for trip to [place] once I have consulted with a physician. Patient agreeable/verbalized understanding. Reports established with two therapists for anxiety management and doing well. Patient denies thoughts of self-harm, suicidal ideations, homicidal ideations. - Reports gastroenterologist recommended HPV vaccination. Would like to confirm if up to date on the same or if any doses are due. I confirmed with Sabino Snipes, CMA patient had 3 doses of HPV and up to date. Patient was informed from the CMA of the same. - Reports several days ago feels that experienced heat exhaustion or possible dehydration after being in the sun for a couple of hours. Since then symptoms have resolved, feeling improved, and staying hydrated. I discussed with patient in detail to seek immediate medical evaluation if symptoms return and patient verbalized understanding/agreement. - No further issues/concerns for discussion today.    Patient Active Problem List   Diagnosis Date Noted   Prediabetes 05/25/2022   EBV infection 02/16/2022   Sore throat 02/16/2022   NAFLD (nonalcoholic fatty liver disease) 96/29/5284   Low serum testosterone level 10/18/2019   Elevated  ALT measurement 11/28/2018   Mixed hyperlipidemia 11/26/2018   Family history of thyroid disease 05/30/2018   Seasonal allergies 01/20/2015     Current Outpatient Medications on File Prior to Visit  Medication Sig Dispense Refill   atorvastatin (LIPITOR) 20 MG tablet TAKE 1 TABLET BY MOUTH EVERY DAY 90 tablet 0   Coenzyme Q10 (COQ-10) 30 MG CAPS Take by mouth.     DESCOVY 200-25 MG tablet Take 1 tablet by mouth daily.     Loratadine (CLARITIN) 10 MG CAPS Take by mouth.     Omega-3 Fatty Acids (FISH OIL) 1200 MG CAPS Take 1,200 mg by mouth 2 (two) times daily.     No current facility-administered medications on file prior to visit.    Allergies  Allergen Reactions   Apple Rash    Itchy throat   Proanthocyanidin Itching   Banana     Itchy throat   Other     Cats and bunnies    Social History   Socioeconomic History   Marital status: Married    Spouse name: Not on file   Number of children: Not on file   Years of education: Not on file   Highest education level: Bachelor's degree (e.g., BA, AB, BS)  Occupational History   Not on file  Tobacco Use   Smoking status: Never    Passive exposure: Never   Smokeless tobacco: Never  Vaping Use   Vaping Use: Never used  Substance and Sexual Activity   Alcohol use: Not Currently   Drug use: No   Sexual activity: Not on file  Other Topics Concern   Not on file  Social History Narrative  Not on file   Social Determinants of Health   Financial Resource Strain: Low Risk  (11/28/2022)   Overall Financial Resource Strain (CARDIA)    Difficulty of Paying Living Expenses: Not hard at all  Food Insecurity: No Food Insecurity (11/28/2022)   Hunger Vital Sign    Worried About Running Out of Food in the Last Year: Never true    Ran Out of Food in the Last Year: Never true  Transportation Needs: No Transportation Needs (11/28/2022)   PRAPARE - Administrator, Civil Service (Medical): No    Lack of Transportation  (Non-Medical): No  Physical Activity: Insufficiently Active (11/28/2022)   Exercise Vital Sign    Days of Exercise per Week: 3 days    Minutes of Exercise per Session: 20 min  Stress: No Stress Concern Present (11/28/2022)   [employer] of Occupational Health - Occupational Stress Questionnaire    Feeling of Stress : Not at all  Social Connections: Unknown (11/28/2022)   Social Connection and Isolation Panel [NHANES]    Frequency of Communication with Friends and Family: Three times a week    Frequency of Social Gatherings with Friends and Family: Three times a week    Attends Religious Services: Patient declined    Active Member of Clubs or Organizations: Yes    Attends Engineer, structural: More than 4 times per year    Marital Status: Married  Catering manager Violence: Not on file    Family History  Problem Relation Age of Onset   Depression Mother    Bipolar disorder Father    Schizophrenia Father    Thyroid disease Paternal Grandmother    Colon cancer Neg Hx    Esophageal cancer Neg Hx     Past Surgical History:  Procedure Laterality Date   MOUTH SURGERY      ROS: Review of Systems Negative except as stated above  PHYSICAL EXAM: BP 117/79   Pulse 81   Temp 98.1 F (36.7 C) (Oral)   Ht 6\' 4"  (1.93 m)   Wt (!) 362 lb 3.2 oz (164.3 kg)   SpO2 94%   BMI 44.09 kg/m   Physical Exam HENT:     Head: Normocephalic and atraumatic.     Mouth/Throat:     Mouth: Mucous membranes are moist.     Pharynx: Oropharynx is clear.  Eyes:     Extraocular Movements: Extraocular movements intact.     Conjunctiva/sclera: Conjunctivae normal.     Pupils: Pupils are equal, round, and reactive to light.  Cardiovascular:     Rate and Rhythm: Normal rate and regular rhythm.     Pulses: Normal pulses.     Heart sounds: Normal heart sounds.  Pulmonary:     Effort: Pulmonary effort is normal.     Breath sounds: Normal breath sounds.  Musculoskeletal:     Cervical  back: Normal range of motion and neck supple.  Neurological:     General: No focal deficit present.     Mental Status: He is alert and oriented to person, place, and time.  Psychiatric:        Mood and Affect: Mood normal.        Behavior: Behavior normal.     ASSESSMENT AND PLAN: 1. Anxiety about health 2. Anxiety with flying - Patient denies thoughts of self-harm, suicidal ideations, homicidal ideations. - Patient declined daily and as needed pharmacological therapy for anxiety management.  - I discussed with patient in detail prescription  for flight anxiety and nausea to be prescribed closer to flight date. Patient aware to notify me in advance closer to date to ensure that prescriptions for flight anxiety and nausea are sent to preferred pharmacy. Patient verbalized understanding and agreement.  - Keep all scheduled appointments with therapist.  - Follow-up with primary provider as scheduled.    Patient was given the opportunity to ask questions.  Patient verbalized understanding of the plan and was able to repeat key elements of the plan. Patient was given clear instructions to go to Emergency Department or return to medical center if symptoms don't improve, worsen, or new problems develop.The patient verbalized understanding.   Follow-up with primary provider as scheduled.  Rema Fendt, NP

## 2023-01-16 NOTE — Progress Notes (Signed)
Pt is looking to get information on Gardisol 9.   Pt wants to talk about anxiety medications. Pt says he attached his Psychiatrists letter to you.

## 2023-01-17 ENCOUNTER — Encounter: Payer: Self-pay | Admitting: Family

## 2023-01-17 NOTE — Telephone Encounter (Signed)
Discussed during office visit on 01/16/2023.

## 2023-01-18 ENCOUNTER — Other Ambulatory Visit: Payer: Self-pay | Admitting: Family

## 2023-01-18 DIAGNOSIS — F411 Generalized anxiety disorder: Secondary | ICD-10-CM

## 2023-01-18 MED ORDER — FLUOXETINE HCL 10 MG PO CAPS
10.0000 mg | ORAL_CAPSULE | Freq: Every day | ORAL | 0 refills | Status: DC
Start: 2023-01-18 — End: 2023-02-11

## 2023-01-18 NOTE — Telephone Encounter (Signed)
Fluoxetine prescribed. Schedule follow-up appointment in 4 weeks or sooner if needed.

## 2023-01-18 NOTE — Telephone Encounter (Signed)
I have attempted without success to contact this patient by phone to I left a message on answering machine.

## 2023-01-21 DIAGNOSIS — Z713 Dietary counseling and surveillance: Secondary | ICD-10-CM | POA: Diagnosis not present

## 2023-01-31 DIAGNOSIS — F411 Generalized anxiety disorder: Secondary | ICD-10-CM | POA: Diagnosis not present

## 2023-02-04 DIAGNOSIS — Z713 Dietary counseling and surveillance: Secondary | ICD-10-CM | POA: Diagnosis not present

## 2023-02-05 ENCOUNTER — Other Ambulatory Visit: Payer: Self-pay | Admitting: Family

## 2023-02-05 DIAGNOSIS — R110 Nausea: Secondary | ICD-10-CM

## 2023-02-05 MED ORDER — DIMENHYDRINATE 50 MG PO TABS
25.0000 mg | ORAL_TABLET | Freq: Three times a day (TID) | ORAL | 1 refills | Status: AC | PRN
Start: 2023-02-05 — End: ?

## 2023-02-05 NOTE — Telephone Encounter (Signed)
-   No interactions with Fluoxetine and Dramamine.  - Dramamine prescribed.

## 2023-02-06 DIAGNOSIS — F411 Generalized anxiety disorder: Secondary | ICD-10-CM | POA: Diagnosis not present

## 2023-02-11 ENCOUNTER — Other Ambulatory Visit: Payer: Self-pay | Admitting: Family

## 2023-02-11 DIAGNOSIS — F411 Generalized anxiety disorder: Secondary | ICD-10-CM

## 2023-02-11 NOTE — Telephone Encounter (Unsigned)
Copied from CRM [id number]. Topic: General - Other >> Feb 11, 2023  3:44 PM Everette C wrote: Reason for CRM: Medication Refill - Medication: FLUoxetine (PROZAC) 10 MG capsule [[id number]]  Has the patient contacted their pharmacy? Yes.   (Agent: If no, request that the patient contact the pharmacy for the refill. If patient does not wish to contact the pharmacy document the reason why and proceed with request.) (Agent: If yes, when and what did the pharmacy advise?)  Preferred Pharmacy (with phone number or street name): [id number], [place] - [address] [place] [address] Phone: [phone] Fax: [phone] Hours: Not open 24 hours   Has the patient been seen for an appointment in the last year OR does the patient have an upcoming appointment? Yes.    Agent: Please be advised that RX refills may take up to 3 business days. We ask that you follow-up with your pharmacy.

## 2023-02-12 MED ORDER — FLUOXETINE HCL 10 MG PO CAPS
10.0000 mg | ORAL_CAPSULE | Freq: Every day | ORAL | 1 refills | Status: DC
Start: 2023-02-12 — End: 2023-08-20

## 2023-02-12 NOTE — Telephone Encounter (Signed)
Requested Prescriptions  Pending Prescriptions Disp Refills   FLUoxetine (PROZAC) 10 MG capsule 90 capsule 1    Sig: Take 1 capsule (10 mg total) by mouth daily.     Psychiatry:  Antidepressants - SSRI Passed - 02/11/2023  5:44 PM      Passed - Valid encounter within last 6 months    Recent Outpatient Visits           3 weeks ago Anxiety about health   [facility], Virginia J, NP   2 months ago Screening for metabolic disorder   [facility], Amy J, NP   6 months ago Screening for metabolic disorder   [facility], Salomon Fick, NP   1 year ago Earache   [facility], [place], NP   1 year ago Tinnitus of left ear   [facility], Salomon Fick, NP

## 2023-02-19 DIAGNOSIS — F411 Generalized anxiety disorder: Secondary | ICD-10-CM | POA: Diagnosis not present

## 2023-02-19 DIAGNOSIS — Z713 Dietary counseling and surveillance: Secondary | ICD-10-CM | POA: Diagnosis not present

## 2023-02-21 ENCOUNTER — Telehealth: Payer: Self-pay | Admitting: Family

## 2023-02-21 NOTE — Telephone Encounter (Signed)
Called Pt left Vm to call office back

## 2023-02-22 ENCOUNTER — Other Ambulatory Visit: Payer: Self-pay

## 2023-02-22 ENCOUNTER — Other Ambulatory Visit: Payer: Self-pay | Admitting: Family

## 2023-02-22 DIAGNOSIS — E785 Hyperlipidemia, unspecified: Secondary | ICD-10-CM

## 2023-02-22 MED ORDER — ATORVASTATIN CALCIUM 20 MG PO TABS
20.0000 mg | ORAL_TABLET | Freq: Every day | ORAL | 0 refills | Status: DC
Start: 2023-02-22 — End: 2023-05-22

## 2023-02-22 NOTE — Telephone Encounter (Signed)
Rx sent in 02/22/2023 @ 8:24am

## 2023-02-22 NOTE — Telephone Encounter (Signed)
Called Pt and let him know that he doesn't need any vaccinations.

## 2023-02-25 DIAGNOSIS — F411 Generalized anxiety disorder: Secondary | ICD-10-CM | POA: Diagnosis not present

## 2023-03-06 ENCOUNTER — Other Ambulatory Visit: Payer: Self-pay | Admitting: Family

## 2023-03-06 ENCOUNTER — Encounter: Payer: Self-pay | Admitting: Family

## 2023-03-06 DIAGNOSIS — Z13228 Encounter for screening for other metabolic disorders: Secondary | ICD-10-CM

## 2023-03-06 DIAGNOSIS — Z130 Encounter for screening for diseases of the blood and blood-forming organs and certain disorders involving the immune mechanism: Secondary | ICD-10-CM

## 2023-03-06 DIAGNOSIS — Z131 Encounter for screening for diabetes mellitus: Secondary | ICD-10-CM

## 2023-03-06 DIAGNOSIS — Z13220 Encounter for screening for lipoid disorders: Secondary | ICD-10-CM

## 2023-03-06 NOTE — Telephone Encounter (Signed)
Schedule lab appointment

## 2023-03-07 DIAGNOSIS — F411 Generalized anxiety disorder: Secondary | ICD-10-CM | POA: Diagnosis not present

## 2023-03-08 ENCOUNTER — Other Ambulatory Visit: Payer: BC Managed Care – PPO

## 2023-03-08 DIAGNOSIS — Z713 Dietary counseling and surveillance: Secondary | ICD-10-CM | POA: Diagnosis not present

## 2023-03-12 ENCOUNTER — Other Ambulatory Visit: Payer: BC Managed Care – PPO

## 2023-03-12 DIAGNOSIS — Z13220 Encounter for screening for lipoid disorders: Secondary | ICD-10-CM

## 2023-03-12 DIAGNOSIS — Z131 Encounter for screening for diabetes mellitus: Secondary | ICD-10-CM | POA: Diagnosis not present

## 2023-03-12 DIAGNOSIS — Z130 Encounter for screening for diseases of the blood and blood-forming organs and certain disorders involving the immune mechanism: Secondary | ICD-10-CM

## 2023-03-12 DIAGNOSIS — Z13228 Encounter for screening for other metabolic disorders: Secondary | ICD-10-CM

## 2023-03-13 LAB — CMP14+EGFR
ALT: 61 IU/L — ABNORMAL HIGH (ref 0–44)
AST: 31 IU/L (ref 0–40)
Albumin: 4.5 g/dL (ref 4.3–5.2)
Alkaline Phosphatase: 76 IU/L (ref 44–121)
BUN/Creatinine Ratio: 11 (ref 9–20)
BUN: 10 mg/dL (ref 6–20)
Bilirubin Total: 0.4 mg/dL (ref 0.0–1.2)
CO2: 22 mmol/L (ref 20–29)
Calcium: 9.2 mg/dL (ref 8.7–10.2)
Chloride: 103 mmol/L (ref 96–106)
Creatinine, Ser: 0.9 mg/dL (ref 0.76–1.27)
Globulin, Total: 2.1 g/dL (ref 1.5–4.5)
Glucose: 91 mg/dL (ref 70–99)
Potassium: 4.6 mmol/L (ref 3.5–5.2)
Sodium: 141 mmol/L (ref 134–144)
Total Protein: 6.6 g/dL (ref 6.0–8.5)
eGFR: 120 mL/min/{1.73_m2} (ref >59)

## 2023-03-13 LAB — CBC
Hematocrit: 48.7 % (ref 37.5–51.0)
Hemoglobin: 16.5 g/dL (ref 13.0–17.7)
MCH: 30.6 pg (ref 26.6–33.0)
MCHC: 33.9 g/dL (ref 31.5–35.7)
MCV: 90 fL (ref 79–97)
Platelets: 266 10*3/uL (ref 150–450)
RBC: 5.39 x10E6/uL (ref 4.14–5.80)
RDW: 12.8 % (ref 11.6–15.4)
WBC: 5.8 10*3/uL (ref 3.4–10.8)

## 2023-03-13 LAB — LIPID PANEL
Chol/HDL Ratio: 4.4 ratio (ref 0.0–5.0)
Cholesterol, Total: 159 mg/dL (ref 100–199)
HDL: 36 mg/dL — ABNORMAL LOW (ref >39)
LDL Chol Calc (NIH): 89 mg/dL (ref 0–99)
Triglycerides: 202 mg/dL — ABNORMAL HIGH (ref 0–149)
VLDL Cholesterol Cal: 34 mg/dL (ref 5–40)

## 2023-03-13 LAB — HEMOGLOBIN A1C
Est. average glucose Bld gHb Est-mCnc: 114 mg/dL
Hgb A1c MFr Bld: 5.6 % (ref 4.8–5.6)

## 2023-03-18 DIAGNOSIS — Z713 Dietary counseling and surveillance: Secondary | ICD-10-CM | POA: Diagnosis not present

## 2023-03-20 DIAGNOSIS — F411 Generalized anxiety disorder: Secondary | ICD-10-CM | POA: Diagnosis not present

## 2023-04-01 DIAGNOSIS — Z713 Dietary counseling and surveillance: Secondary | ICD-10-CM | POA: Diagnosis not present

## 2023-04-03 DIAGNOSIS — F411 Generalized anxiety disorder: Secondary | ICD-10-CM | POA: Diagnosis not present

## 2023-04-17 DIAGNOSIS — F411 Generalized anxiety disorder: Secondary | ICD-10-CM | POA: Diagnosis not present

## 2023-04-18 DIAGNOSIS — Z713 Dietary counseling and surveillance: Secondary | ICD-10-CM | POA: Diagnosis not present

## 2023-04-27 ENCOUNTER — Encounter: Payer: Self-pay | Admitting: Family

## 2023-04-29 NOTE — Telephone Encounter (Signed)
Form will be at nurse station.

## 2023-05-01 DIAGNOSIS — F411 Generalized anxiety disorder: Secondary | ICD-10-CM | POA: Diagnosis not present

## 2023-05-03 ENCOUNTER — Other Ambulatory Visit: Payer: Self-pay | Admitting: Nurse Practitioner

## 2023-05-03 DIAGNOSIS — R7401 Elevation of levels of liver transaminase levels: Secondary | ICD-10-CM

## 2023-05-03 DIAGNOSIS — K760 Fatty (change of) liver, not elsewhere classified: Secondary | ICD-10-CM | POA: Diagnosis not present

## 2023-05-14 DIAGNOSIS — Z713 Dietary counseling and surveillance: Secondary | ICD-10-CM | POA: Diagnosis not present

## 2023-05-15 ENCOUNTER — Ambulatory Visit
Admission: RE | Admit: 2023-05-15 | Discharge: 2023-05-15 | Disposition: A | Discharge status: Home / Self Care | Payer: BC Managed Care – PPO | Source: Ambulatory Visit | Attending: Nurse Practitioner | Admitting: Nurse Practitioner

## 2023-05-15 DIAGNOSIS — R7401 Elevation of levels of liver transaminase levels: Secondary | ICD-10-CM

## 2023-05-15 DIAGNOSIS — F411 Generalized anxiety disorder: Secondary | ICD-10-CM | POA: Diagnosis not present

## 2023-05-15 DIAGNOSIS — R7989 Other specified abnormal findings of blood chemistry: Secondary | ICD-10-CM | POA: Diagnosis not present

## 2023-05-15 DIAGNOSIS — K760 Fatty (change of) liver, not elsewhere classified: Secondary | ICD-10-CM

## 2023-05-22 ENCOUNTER — Other Ambulatory Visit: Payer: Self-pay | Admitting: Family

## 2023-05-22 ENCOUNTER — Other Ambulatory Visit: Payer: Self-pay

## 2023-05-22 DIAGNOSIS — E785 Hyperlipidemia, unspecified: Secondary | ICD-10-CM

## 2023-05-22 MED ORDER — ATORVASTATIN CALCIUM 20 MG PO TABS: 20.0000 mg | ORAL_TABLET | Freq: Every day | ORAL | 0 refills | Status: DC

## 2023-05-28 DIAGNOSIS — Z713 Dietary counseling and surveillance: Secondary | ICD-10-CM | POA: Diagnosis not present

## 2023-05-29 DIAGNOSIS — F411 Generalized anxiety disorder: Secondary | ICD-10-CM | POA: Diagnosis not present

## 2023-06-05 DIAGNOSIS — F411 Generalized anxiety disorder: Secondary | ICD-10-CM | POA: Diagnosis not present

## 2023-06-06 ENCOUNTER — Encounter: Payer: Self-pay | Admitting: Family

## 2023-06-07 ENCOUNTER — Other Ambulatory Visit: Payer: Self-pay | Admitting: Family

## 2023-06-07 DIAGNOSIS — Z131 Encounter for screening for diabetes mellitus: Secondary | ICD-10-CM

## 2023-06-07 DIAGNOSIS — Z13220 Encounter for screening for lipoid disorders: Secondary | ICD-10-CM

## 2023-06-07 DIAGNOSIS — Z13228 Encounter for screening for other metabolic disorders: Secondary | ICD-10-CM

## 2023-06-07 NOTE — Telephone Encounter (Signed)
Complete

## 2023-06-13 ENCOUNTER — Other Ambulatory Visit: Payer: Self-pay | Admitting: Medical Genetics

## 2023-06-13 DIAGNOSIS — Z006 Encounter for examination for normal comparison and control in clinical research program: Secondary | ICD-10-CM

## 2023-06-18 ENCOUNTER — Other Ambulatory Visit: Payer: BC Managed Care – PPO

## 2023-06-18 DIAGNOSIS — Z13220 Encounter for screening for lipoid disorders: Secondary | ICD-10-CM

## 2023-06-18 DIAGNOSIS — Z131 Encounter for screening for diabetes mellitus: Secondary | ICD-10-CM | POA: Diagnosis not present

## 2023-06-18 DIAGNOSIS — Z13228 Encounter for screening for other metabolic disorders: Secondary | ICD-10-CM

## 2023-06-18 DIAGNOSIS — Z713 Dietary counseling and surveillance: Secondary | ICD-10-CM | POA: Diagnosis not present

## 2023-06-19 DIAGNOSIS — F411 Generalized anxiety disorder: Secondary | ICD-10-CM | POA: Diagnosis not present

## 2023-06-19 LAB — CMP14+EGFR
ALT: 53 [IU]/L — ABNORMAL HIGH (ref 0–44)
AST: 43 [IU]/L — ABNORMAL HIGH (ref 0–40)
Albumin: 4.4 g/dL (ref 4.3–5.2)
Alkaline Phosphatase: 84 [IU]/L (ref 44–121)
BUN/Creatinine Ratio: 11 (ref 9–20)
BUN: 9 mg/dL (ref 6–20)
Bilirubin Total: 0.5 mg/dL (ref 0.0–1.2)
CO2: 23 mmol/L (ref 20–29)
Calcium: 9.1 mg/dL (ref 8.7–10.2)
Chloride: 103 mmol/L (ref 96–106)
Creatinine, Ser: 0.8 mg/dL (ref 0.76–1.27)
Globulin, Total: 2.5 g/dL (ref 1.5–4.5)
Glucose: 80 mg/dL (ref 70–99)
Potassium: 4.5 mmol/L (ref 3.5–5.2)
Sodium: 141 mmol/L (ref 134–144)
Total Protein: 6.9 g/dL (ref 6.0–8.5)
eGFR: 124 mL/min/{1.73_m2} (ref >59)

## 2023-06-19 LAB — LIPID PANEL
Chol/HDL Ratio: 4.4 {ratio} (ref 0.0–5.0)
Cholesterol, Total: 175 mg/dL (ref 100–199)
HDL: 40 mg/dL (ref >39)
LDL Chol Calc (NIH): 105 mg/dL — ABNORMAL HIGH (ref 0–99)
Triglycerides: 170 mg/dL — ABNORMAL HIGH (ref 0–149)
VLDL Cholesterol Cal: 30 mg/dL (ref 5–40)

## 2023-06-19 LAB — HEMOGLOBIN A1C
Est. average glucose Bld gHb Est-mCnc: 114 mg/dL
Hgb A1c MFr Bld: 5.6 % (ref 4.8–5.6)

## 2023-06-26 ENCOUNTER — Other Ambulatory Visit: Payer: Self-pay | Admitting: Family

## 2023-06-26 DIAGNOSIS — Z1329 Encounter for screening for other suspected endocrine disorder: Secondary | ICD-10-CM

## 2023-06-26 NOTE — Telephone Encounter (Signed)
Pt called in requesting to do labs for thyroid and testosterone per letter request from nutritionist.  Did not see orders for testosterone and thryoid. Pt requesting orders be added so he may schedule appointment to come in and get those done. Pt would like mychart message.

## 2023-07-01 ENCOUNTER — Other Ambulatory Visit: Payer: BC Managed Care – PPO

## 2023-07-01 DIAGNOSIS — Z1329 Encounter for screening for other suspected endocrine disorder: Secondary | ICD-10-CM

## 2023-07-02 DIAGNOSIS — Z713 Dietary counseling and surveillance: Secondary | ICD-10-CM | POA: Diagnosis not present

## 2023-07-10 DIAGNOSIS — F329 Major depressive disorder, single episode, unspecified: Secondary | ICD-10-CM | POA: Diagnosis not present

## 2023-07-12 ENCOUNTER — Other Ambulatory Visit (HOSPITAL_COMMUNITY)
Admission: RE | Admit: 2023-07-12 | Discharge: 2023-07-12 | Disposition: A | Discharge status: Home / Self Care | Payer: Self-pay | Source: Ambulatory Visit | Attending: Medical Genetics | Admitting: Medical Genetics

## 2023-07-12 DIAGNOSIS — Z006 Encounter for examination for normal comparison and control in clinical research program: Secondary | ICD-10-CM | POA: Insufficient documentation

## 2023-07-15 LAB — HORMONE PANEL (T4,TSH,FSH,TESTT,SHBG,DHEA,ETC)
DHEA-Sulfate, LCMS: 279 ug/dL
Estrone Sulfate: 131 ng/dL
Follicle Stimulating Hormone: 1.5 m[IU]/mL — ABNORMAL LOW
Free Testosterone, Serum: 55 pg/mL
Progesterone, Serum: <10 ng/dL
Sex Hormone Binding Globulin: 28.3 nmol/L
Testosterone, Serum (Total): 252 ng/dL — ABNORMAL LOW
Testosterone-% Free: 2.2 %

## 2023-07-15 LAB — TSH+T4F+T3FREE
Free T4: 1.14 ng/dL (ref 0.82–1.77)
T3, Free: 3.7 pg/mL (ref 2.0–4.4)
TSH: 1.3 u[IU]/mL (ref 0.450–4.500)

## 2023-07-22 ENCOUNTER — Other Ambulatory Visit: Payer: Self-pay | Admitting: Family

## 2023-07-22 ENCOUNTER — Encounter (INDEPENDENT_AMBULATORY_CARE_PROVIDER_SITE_OTHER): Payer: Self-pay

## 2023-07-22 DIAGNOSIS — R7989 Other specified abnormal findings of blood chemistry: Secondary | ICD-10-CM

## 2023-07-22 LAB — GENECONNECT MOLECULAR SCREEN: Genetic Analysis Overall Interpretation: NEGATIVE

## 2023-07-24 DIAGNOSIS — Z713 Dietary counseling and surveillance: Secondary | ICD-10-CM | POA: Diagnosis not present

## 2023-07-24 DIAGNOSIS — F411 Generalized anxiety disorder: Secondary | ICD-10-CM | POA: Diagnosis not present

## 2023-07-31 ENCOUNTER — Ambulatory Visit: Payer: BC Managed Care – PPO | Admitting: Family

## 2023-08-01 ENCOUNTER — Ambulatory Visit: Payer: BC Managed Care – PPO | Admitting: Family

## 2023-08-06 DIAGNOSIS — F411 Generalized anxiety disorder: Secondary | ICD-10-CM | POA: Diagnosis not present

## 2023-08-06 DIAGNOSIS — Z713 Dietary counseling and surveillance: Secondary | ICD-10-CM | POA: Diagnosis not present

## 2023-08-14 ENCOUNTER — Ambulatory Visit: Payer: BC Managed Care – PPO

## 2023-08-14 DIAGNOSIS — R7989 Other specified abnormal findings of blood chemistry: Secondary | ICD-10-CM

## 2023-08-15 DIAGNOSIS — E291 Testicular hypofunction: Secondary | ICD-10-CM | POA: Diagnosis not present

## 2023-08-16 ENCOUNTER — Other Ambulatory Visit: Payer: Self-pay | Admitting: Family

## 2023-08-16 DIAGNOSIS — E785 Hyperlipidemia, unspecified: Secondary | ICD-10-CM

## 2023-08-17 ENCOUNTER — Other Ambulatory Visit: Payer: Self-pay | Admitting: Family

## 2023-08-17 DIAGNOSIS — F411 Generalized anxiety disorder: Secondary | ICD-10-CM

## 2023-08-19 ENCOUNTER — Telehealth: Payer: Self-pay | Admitting: Family

## 2023-08-19 NOTE — Telephone Encounter (Signed)
Marland Kitchen

## 2023-08-20 DIAGNOSIS — Z713 Dietary counseling and surveillance: Secondary | ICD-10-CM | POA: Diagnosis not present

## 2023-08-20 NOTE — Telephone Encounter (Signed)
Complete. Schedule appointment.

## 2023-08-21 ENCOUNTER — Encounter: Payer: Self-pay | Admitting: Family

## 2023-08-27 DIAGNOSIS — F411 Generalized anxiety disorder: Secondary | ICD-10-CM | POA: Diagnosis not present

## 2023-08-29 ENCOUNTER — Other Ambulatory Visit: Payer: Self-pay | Admitting: Family

## 2023-08-29 ENCOUNTER — Encounter: Payer: Self-pay | Admitting: Family

## 2023-08-29 DIAGNOSIS — R7989 Other specified abnormal findings of blood chemistry: Secondary | ICD-10-CM

## 2023-08-29 LAB — HORMONE PANEL (T4,TSH,FSH,TESTT,SHBG,DHEA,ETC)
DHEA-Sulfate, LCMS: 271 ug/dL
Estradiol, Serum, MS: 15 pg/mL
Estrone Sulfate: 132 ng/dL
Follicle Stimulating Hormone: 1.3 m[IU]/mL — ABNORMAL LOW
Free T-3: 3.9 pg/mL
Free Testosterone, Serum: 54 pg/mL
Progesterone, Serum: <10 ng/dL
Sex Hormone Binding Globulin: 21.1 nmol/L
T4: 8.6 ug/dL
TSH: 1.6 uU/mL
Testosterone, Serum (Total): 200 ng/dL — ABNORMAL LOW
Testosterone-% Free: 2.7 %
Triiodothyronine (T-3), Serum: 143 ng/dL

## 2023-09-03 ENCOUNTER — Telehealth: Payer: Self-pay | Admitting: Family

## 2023-09-03 NOTE — Telephone Encounter (Signed)
Copied from CRM [id number]. Topic: Referral - Status >> Sep 03, 2023  8:42 AM Elle L wrote: Reason for CRM: [facility] was calling regarding a referral that they received for the patient as they state the insurance information is ineligible. Their call back number is [phone].

## 2023-09-04 DIAGNOSIS — E291 Testicular hypofunction: Secondary | ICD-10-CM | POA: Diagnosis not present

## 2023-09-04 DIAGNOSIS — R7303 Prediabetes: Secondary | ICD-10-CM | POA: Diagnosis not present

## 2023-09-04 DIAGNOSIS — E7800 Pure hypercholesterolemia, unspecified: Secondary | ICD-10-CM | POA: Diagnosis not present

## 2023-09-06 NOTE — Telephone Encounter (Signed)
Faxed over patients insurance card information after verfiying insurance is still active

## 2023-09-16 NOTE — Telephone Encounter (Signed)
 [place] Immunization form not pick up from office ~Mailed to address on file

## 2023-09-19 DIAGNOSIS — Z713 Dietary counseling and surveillance: Secondary | ICD-10-CM | POA: Diagnosis not present

## 2023-09-25 DIAGNOSIS — E291 Testicular hypofunction: Secondary | ICD-10-CM | POA: Diagnosis not present

## 2023-09-27 DIAGNOSIS — F40231 Fear of injections and transfusions: Secondary | ICD-10-CM | POA: Diagnosis not present

## 2023-09-27 DIAGNOSIS — E291 Testicular hypofunction: Secondary | ICD-10-CM | POA: Diagnosis not present

## 2023-09-29 ENCOUNTER — Encounter: Payer: Self-pay | Admitting: Family

## 2023-09-30 ENCOUNTER — Other Ambulatory Visit (INDEPENDENT_AMBULATORY_CARE_PROVIDER_SITE_OTHER): Payer: Self-pay

## 2023-09-30 DIAGNOSIS — Z131 Encounter for screening for diabetes mellitus: Secondary | ICD-10-CM

## 2023-09-30 DIAGNOSIS — Z713 Dietary counseling and surveillance: Secondary | ICD-10-CM | POA: Diagnosis not present

## 2023-09-30 DIAGNOSIS — Z130 Encounter for screening for diseases of the blood and blood-forming organs and certain disorders involving the immune mechanism: Secondary | ICD-10-CM

## 2023-09-30 DIAGNOSIS — Z13228 Encounter for screening for other metabolic disorders: Secondary | ICD-10-CM

## 2023-09-30 DIAGNOSIS — F411 Generalized anxiety disorder: Secondary | ICD-10-CM | POA: Diagnosis not present

## 2023-09-30 DIAGNOSIS — Z13220 Encounter for screening for lipoid disorders: Secondary | ICD-10-CM

## 2023-10-03 ENCOUNTER — Other Ambulatory Visit: Payer: Self-pay | Admitting: Endocrinology

## 2023-10-03 DIAGNOSIS — E237 Disorder of pituitary gland, unspecified: Secondary | ICD-10-CM

## 2023-10-04 ENCOUNTER — Other Ambulatory Visit

## 2023-10-04 DIAGNOSIS — Z130 Encounter for screening for diseases of the blood and blood-forming organs and certain disorders involving the immune mechanism: Secondary | ICD-10-CM

## 2023-10-04 DIAGNOSIS — Z13228 Encounter for screening for other metabolic disorders: Secondary | ICD-10-CM

## 2023-10-04 DIAGNOSIS — Z13220 Encounter for screening for lipoid disorders: Secondary | ICD-10-CM

## 2023-10-04 DIAGNOSIS — Z131 Encounter for screening for diabetes mellitus: Secondary | ICD-10-CM | POA: Diagnosis not present

## 2023-10-05 LAB — CBC WITH DIFFERENTIAL/PLATELET
Basophils Absolute: 0 10*3/uL (ref 0.0–0.2)
Basos: 1 %
EOS (ABSOLUTE): 0.7 10*3/uL — ABNORMAL HIGH (ref 0.0–0.4)
Eos: 10 %
Hematocrit: 49.4 % (ref 37.5–51.0)
Hemoglobin: 16.5 g/dL (ref 13.0–17.7)
Immature Grans (Abs): 0 10*3/uL (ref 0.0–0.1)
Immature Granulocytes: 0 %
Lymphocytes Absolute: 2.5 10*3/uL (ref 0.7–3.1)
Lymphs: 38 %
MCH: 29.9 pg (ref 26.6–33.0)
MCHC: 33.4 g/dL (ref 31.5–35.7)
MCV: 90 fL (ref 79–97)
Monocytes Absolute: 0.6 10*3/uL (ref 0.1–0.9)
Monocytes: 9 %
Neutrophils Absolute: 2.9 10*3/uL (ref 1.4–7.0)
Neutrophils: 42 %
Platelets: 308 10*3/uL (ref 150–450)
RBC: 5.51 x10E6/uL (ref 4.14–5.80)
RDW: 12.1 % (ref 11.6–15.4)
WBC: 6.7 10*3/uL (ref 3.4–10.8)

## 2023-10-05 LAB — LIPID PANEL
Chol/HDL Ratio: 4.2 ratio (ref 0.0–5.0)
Cholesterol, Total: 157 mg/dL (ref 100–199)
HDL: 37 mg/dL — ABNORMAL LOW (ref >39)
LDL Chol Calc (NIH): 97 mg/dL (ref 0–99)
Triglycerides: 126 mg/dL (ref 0–149)
VLDL Cholesterol Cal: 23 mg/dL (ref 5–40)

## 2023-10-05 LAB — HEMOGLOBIN A1C
Est. average glucose Bld gHb Est-mCnc: 117 mg/dL
Hgb A1c MFr Bld: 5.7 % — ABNORMAL HIGH (ref 4.8–5.6)

## 2023-10-05 LAB — CMP14+EGFR
ALT: 45 IU/L — ABNORMAL HIGH (ref 0–44)
AST: 26 IU/L (ref 0–40)
Albumin: 4.4 g/dL (ref 4.3–5.2)
Alkaline Phosphatase: 76 IU/L (ref 44–121)
BUN/Creatinine Ratio: 9 (ref 9–20)
BUN: 8 mg/dL (ref 6–20)
Bilirubin Total: 0.2 mg/dL (ref 0.0–1.2)
CO2: 21 mmol/L (ref 20–29)
Calcium: 9.2 mg/dL (ref 8.7–10.2)
Chloride: 102 mmol/L (ref 96–106)
Creatinine, Ser: 0.86 mg/dL (ref 0.76–1.27)
Globulin, Total: 2.2 g/dL (ref 1.5–4.5)
Glucose: 87 mg/dL (ref 70–99)
Potassium: 4.5 mmol/L (ref 3.5–5.2)
Sodium: 140 mmol/L (ref 134–144)
Total Protein: 6.6 g/dL (ref 6.0–8.5)
eGFR: 121 mL/min/{1.73_m2} (ref >59)

## 2023-10-07 ENCOUNTER — Encounter: Payer: Self-pay | Admitting: Family

## 2023-10-17 DIAGNOSIS — F411 Generalized anxiety disorder: Secondary | ICD-10-CM | POA: Diagnosis not present

## 2023-10-25 DIAGNOSIS — Z713 Dietary counseling and surveillance: Secondary | ICD-10-CM | POA: Diagnosis not present

## 2023-10-29 DIAGNOSIS — F411 Generalized anxiety disorder: Secondary | ICD-10-CM | POA: Diagnosis not present

## 2023-11-04 ENCOUNTER — Encounter: Payer: Self-pay | Admitting: Endocrinology

## 2023-11-05 ENCOUNTER — Ambulatory Visit
Admission: RE | Admit: 2023-11-05 | Discharge: 2023-11-05 | Disposition: A | Discharge status: Home / Self Care | Source: Ambulatory Visit | Attending: Endocrinology | Admitting: Endocrinology

## 2023-11-05 DIAGNOSIS — E237 Disorder of pituitary gland, unspecified: Secondary | ICD-10-CM | POA: Diagnosis not present

## 2023-11-05 MED ORDER — GADOPICLENOL 0.5 MMOL/ML IV SOLN
10.0000 mL | Freq: Once | INTRAVENOUS | Status: AC | PRN
Start: 2023-11-05 — End: 2023-11-05
  Administered 2023-11-05: 10 mL via INTRAVENOUS

## 2023-11-12 DIAGNOSIS — E291 Testicular hypofunction: Secondary | ICD-10-CM | POA: Diagnosis not present

## 2023-11-14 ENCOUNTER — Other Ambulatory Visit: Payer: Self-pay | Admitting: Family

## 2023-11-14 DIAGNOSIS — Z713 Dietary counseling and surveillance: Secondary | ICD-10-CM | POA: Diagnosis not present

## 2023-11-14 DIAGNOSIS — E785 Hyperlipidemia, unspecified: Secondary | ICD-10-CM

## 2023-11-14 NOTE — Telephone Encounter (Signed)
 Complete

## 2023-11-15 ENCOUNTER — Other Ambulatory Visit: Payer: Self-pay | Admitting: Family

## 2023-11-15 DIAGNOSIS — F411 Generalized anxiety disorder: Secondary | ICD-10-CM

## 2023-11-15 NOTE — Telephone Encounter (Signed)
 Schedule appointment. Last office visit 01/16/2023. Please let me know if I can further assist.

## 2023-11-21 ENCOUNTER — Other Ambulatory Visit: Payer: Self-pay | Admitting: Family

## 2023-11-21 DIAGNOSIS — F411 Generalized anxiety disorder: Secondary | ICD-10-CM

## 2023-11-25 DIAGNOSIS — F411 Generalized anxiety disorder: Secondary | ICD-10-CM | POA: Diagnosis not present

## 2023-11-26 ENCOUNTER — Encounter: Payer: Self-pay | Admitting: Family

## 2023-11-26 ENCOUNTER — Ambulatory Visit (INDEPENDENT_AMBULATORY_CARE_PROVIDER_SITE_OTHER): Admitting: Family

## 2023-11-26 VITALS — BP 123/82 | HR 86 | Temp 98.4°F | Resp 18 | Ht 76.0 in | Wt 367.2 lb

## 2023-11-26 DIAGNOSIS — Z713 Dietary counseling and surveillance: Secondary | ICD-10-CM | POA: Diagnosis not present

## 2023-11-26 DIAGNOSIS — F411 Generalized anxiety disorder: Secondary | ICD-10-CM | POA: Diagnosis not present

## 2023-11-26 MED ORDER — FLUOXETINE HCL 10 MG PO CAPS: 10.0000 mg | ORAL_CAPSULE | Freq: Every day | ORAL | 0 refills | Status: DC

## 2023-11-26 NOTE — Progress Notes (Signed)
 Patient ID: Jonathan Kaufman, male    DOB: Apr 12, 1996  MRN: [id number]  CC: Anxiety Follow-Up  Subjective: Jonathan Kaufman is a 28 y.o. male who presents for anxiety follow-up.   His concerns today include:  - Doing well on Fluoxetine . States he went one week without taking Fluoxetine  to see how he felt and anxiety increased. States since then he started back taking Fluoxetine  with no issues/concerns. Established with therapist. He denies thoughts of self-harm, suicidal ideations, homicidal ideations. - Established with nutritionist.  - Established with Endocrinology.  - Established with Urology.   Patient Active Problem List   Diagnosis Date Noted   Prediabetes 05/25/2022   EBV infection 02/16/2022   Sore throat 02/16/2022   NAFLD (nonalcoholic fatty liver disease) 40/98/1191   Low serum testosterone  level 10/18/2019   Elevated ALT measurement 11/28/2018   Mixed hyperlipidemia 11/26/2018   Family history of thyroid  disease 05/30/2018   Seasonal allergies 01/20/2015     Current Outpatient Medications on File Prior to Visit  Medication Sig Dispense Refill   atorvastatin  (LIPITOR) 20 MG tablet TAKE 1 TABLET BY MOUTH EVERY DAY 90 tablet 0   DESCOVY 200-25 MG tablet Take 1 tablet by mouth daily.     Loratadine (CLARITIN) 10 MG CAPS Take by mouth.     Omega-3 Fatty Acids (FISH OIL) 1200 MG CAPS Take 1,200 mg by mouth 2 (two) times daily.     atorvastatin  (LIPITOR) 20 MG tablet TAKE 1 TABLET BY MOUTH EVERY DAY 90 tablet 0   Coenzyme Q10 (COQ-10) 30 MG CAPS Take by mouth.     dimenhyDRINATE  (DRAMAMINE) 50 MG tablet Take 0.5 tablets (25 mg total) by mouth every 8 (eight) hours as needed. 30 tablet 1   No current facility-administered medications on file prior to visit.    Allergies  Allergen Reactions   Apple Rash    Itchy throat   Proanthocyanidin Itching   Banana     Itchy throat   Other     Cats and bunnies    Social History   Socioeconomic History   Marital status: Married     Spouse name: Not on file   Number of children: Not on file   Years of education: Not on file   Highest education level: Bachelor's degree (e.g., BA, AB, BS)  Occupational History   Not on file  Tobacco Use   Smoking status: Never    Passive exposure: Never   Smokeless tobacco: Never  Vaping Use   Vaping status: Never Used  Substance and Sexual Activity   Alcohol use: Not Currently   Drug use: No   Sexual activity: Not on file  Other Topics Concern   Not on file  Social History Narrative   Not on file   Social Drivers of Health   Financial Resource Strain: Low Risk  (11/25/2023)   Overall Financial Resource Strain (CARDIA)    Difficulty of Paying Living Expenses: Not hard at all  Food Insecurity: No Food Insecurity (11/25/2023)   Hunger Vital Sign    Worried About Running Out of Food in the Last Year: Never true    Ran Out of Food in the Last Year: Never true  Transportation Needs: No Transportation Needs (11/25/2023)   PRAPARE - Administrator, Civil Service (Medical): No    Lack of Transportation (Non-Medical): No  Physical Activity: Sufficiently Active (11/25/2023)   Exercise Vital Sign    Days of Exercise per Week: 3 days  Minutes of Exercise per Session: 90 min  Stress: No Stress Concern Present (11/25/2023)   [employer] of Occupational Health - Occupational Stress Questionnaire    Feeling of Stress : Only a little  Social Connections: Moderately Integrated (11/25/2023)   Social Connection and Isolation Panel [NHANES]    Frequency of Communication with Friends and Family: Three times a week    Frequency of Social Gatherings with Friends and Family: Twice a week    Attends Religious Services: Never    Database administrator or Organizations: Yes    Attends Engineer, structural: More than 4 times per year    Marital Status: Married  Catering manager Violence: Not on file    Family History  Problem Relation Age of Onset   Depression Mother     Bipolar disorder Father    Schizophrenia Father    Thyroid  disease Paternal Grandmother    Colon cancer Neg Hx    Esophageal cancer Neg Hx     Past Surgical History:  Procedure Laterality Date   MOUTH SURGERY      ROS: Review of Systems Negative except as stated above  PHYSICAL EXAM: BP 123/82   Pulse 86   Temp 98.4 F (36.9 C) (Oral)   Resp 18   Ht 6\' 4"  (1.93 m)   Wt (!) 367 lb 3.2 oz (166.6 kg)   SpO2 94%   BMI 44.70 kg/m   Physical Exam HENT:     Head: Normocephalic and atraumatic.     Nose: Nose normal.     Mouth/Throat:     Mouth: Mucous membranes are moist.     Pharynx: Oropharynx is clear.  Eyes:     Extraocular Movements: Extraocular movements intact.     Conjunctiva/sclera: Conjunctivae normal.     Pupils: Pupils are equal, round, and reactive to light.  Cardiovascular:     Rate and Rhythm: Normal rate and regular rhythm.     Pulses: Normal pulses.     Heart sounds: Normal heart sounds.  Pulmonary:     Effort: Pulmonary effort is normal.     Breath sounds: Normal breath sounds.  Musculoskeletal:        General: Normal range of motion.     Cervical back: Normal range of motion and neck supple.  Neurological:     General: No focal deficit present.     Mental Status: He is alert and oriented to person, place, and time.  Psychiatric:        Mood and Affect: Mood normal.        Behavior: Behavior normal.    ASSESSMENT AND PLAN: 1. GAD (generalized anxiety disorder) (Primary) - Patient denies thoughts of self-harm, suicidal ideations, homicidal ideations. - Continue Fluoxetine  as prescribed. Counseled on medication adherence/adverse effects.  - Keep all scheduled appointments with therapist.  - Follow-up with primary provider in 3 months or sooner if needed.  - FLUoxetine  (PROZAC ) 10 MG capsule; Take 1 capsule (10 mg total) by mouth daily.  Dispense: 90 capsule; Refill: 0  Patient was given the opportunity to ask questions.  Patient verbalized  understanding of the plan and was able to repeat key elements of the plan. Patient was given clear instructions to go to Emergency Department or return to medical center if symptoms don't improve, worsen, or new problems develop.The patient verbalized understanding.    Requested Prescriptions   Signed Prescriptions Disp Refills   FLUoxetine  (PROZAC ) 10 MG capsule 90 capsule 0    Sig:  Take 1 capsule (10 mg total) by mouth daily.    Return in about 3 months (around 02/26/2024) for Follow-Up or next available anxiety .  Senaida Dama, NP

## 2023-11-26 NOTE — Progress Notes (Signed)
 No concerns.

## 2023-12-09 DIAGNOSIS — F429 Obsessive-compulsive disorder, unspecified: Secondary | ICD-10-CM | POA: Diagnosis not present

## 2023-12-11 DIAGNOSIS — Z713 Dietary counseling and surveillance: Secondary | ICD-10-CM | POA: Diagnosis not present

## 2023-12-23 DIAGNOSIS — J0100 Acute maxillary sinusitis, unspecified: Secondary | ICD-10-CM | POA: Diagnosis not present

## 2023-12-23 DIAGNOSIS — R051 Acute cough: Secondary | ICD-10-CM | POA: Diagnosis not present

## 2023-12-23 DIAGNOSIS — R062 Wheezing: Secondary | ICD-10-CM | POA: Diagnosis not present

## 2023-12-26 DIAGNOSIS — Z713 Dietary counseling and surveillance: Secondary | ICD-10-CM | POA: Diagnosis not present

## 2023-12-27 DIAGNOSIS — F429 Obsessive-compulsive disorder, unspecified: Secondary | ICD-10-CM | POA: Diagnosis not present

## 2024-01-08 DIAGNOSIS — F429 Obsessive-compulsive disorder, unspecified: Secondary | ICD-10-CM | POA: Diagnosis not present

## 2024-01-09 DIAGNOSIS — Z713 Dietary counseling and surveillance: Secondary | ICD-10-CM | POA: Diagnosis not present

## 2024-01-14 ENCOUNTER — Encounter: Payer: Self-pay | Admitting: Family

## 2024-01-14 ENCOUNTER — Other Ambulatory Visit: Payer: Self-pay | Admitting: Family

## 2024-01-14 DIAGNOSIS — Z13220 Encounter for screening for lipoid disorders: Secondary | ICD-10-CM

## 2024-01-14 DIAGNOSIS — Z13228 Encounter for screening for other metabolic disorders: Secondary | ICD-10-CM

## 2024-01-14 DIAGNOSIS — Z130 Encounter for screening for diseases of the blood and blood-forming organs and certain disorders involving the immune mechanism: Secondary | ICD-10-CM

## 2024-01-14 DIAGNOSIS — Z131 Encounter for screening for diabetes mellitus: Secondary | ICD-10-CM

## 2024-01-14 NOTE — Telephone Encounter (Signed)
 Called pt and left vm to call office back to schedule lab appt

## 2024-01-14 NOTE — Telephone Encounter (Signed)
 Complete. Schedule lab only appointment for collection.

## 2024-01-16 ENCOUNTER — Other Ambulatory Visit (INDEPENDENT_AMBULATORY_CARE_PROVIDER_SITE_OTHER)

## 2024-01-16 DIAGNOSIS — Z13220 Encounter for screening for lipoid disorders: Secondary | ICD-10-CM

## 2024-01-16 DIAGNOSIS — Z131 Encounter for screening for diabetes mellitus: Secondary | ICD-10-CM | POA: Diagnosis not present

## 2024-01-16 DIAGNOSIS — Z13228 Encounter for screening for other metabolic disorders: Secondary | ICD-10-CM | POA: Diagnosis not present

## 2024-01-16 DIAGNOSIS — Z130 Encounter for screening for diseases of the blood and blood-forming organs and certain disorders involving the immune mechanism: Secondary | ICD-10-CM | POA: Diagnosis not present

## 2024-01-16 NOTE — Progress Notes (Signed)
 CBC - Released 01/16/2024 8:24 AM         Routine, Clinic Collect     CMP14+EGFR - Released 01/16/2024 8:24 AM        Routine, Clinic Collect     Hemoglobin A1c - Released 01/16/2024 8:24 AM        Routine, Clinic Collect     Lipid panel - Released 01/16/2024 8:24 AM       Labs drawn by Connell

## 2024-01-17 ENCOUNTER — Ambulatory Visit: Payer: Self-pay | Admitting: Family

## 2024-01-17 LAB — CMP14+EGFR
ALT: 59 IU/L — ABNORMAL HIGH (ref 0–44)
AST: 29 IU/L (ref 0–40)
Albumin: 4.7 g/dL (ref 4.3–5.2)
Alkaline Phosphatase: 80 IU/L (ref 44–121)
BUN/Creatinine Ratio: 14 (ref 9–20)
BUN: 14 mg/dL (ref 6–20)
Bilirubin Total: 0.4 mg/dL (ref 0.0–1.2)
CO2: 21 mmol/L (ref 20–29)
Calcium: 9.6 mg/dL (ref 8.7–10.2)
Chloride: 101 mmol/L (ref 96–106)
Creatinine, Ser: 0.97 mg/dL (ref 0.76–1.27)
Globulin, Total: 2.4 g/dL (ref 1.5–4.5)
Glucose: 80 mg/dL (ref 70–99)
Potassium: 4.5 mmol/L (ref 3.5–5.2)
Sodium: 139 mmol/L (ref 134–144)
Total Protein: 7.1 g/dL (ref 6.0–8.5)
eGFR: 109 mL/min/{1.73_m2} (ref >59)

## 2024-01-17 LAB — CBC
Hematocrit: 50 % (ref 37.5–51.0)
Hemoglobin: 16.3 g/dL (ref 13.0–17.7)
MCH: 29.6 pg (ref 26.6–33.0)
MCHC: 32.6 g/dL (ref 31.5–35.7)
MCV: 91 fL (ref 79–97)
Platelets: 246 10*3/uL (ref 150–450)
RBC: 5.51 x10E6/uL (ref 4.14–5.80)
RDW: 12.9 % (ref 11.6–15.4)
WBC: 7 10*3/uL (ref 3.4–10.8)

## 2024-01-17 LAB — LIPID PANEL
Chol/HDL Ratio: 4.6 ratio (ref 0.0–5.0)
Cholesterol, Total: 173 mg/dL (ref 100–199)
HDL: 38 mg/dL — ABNORMAL LOW (ref >39)
LDL Chol Calc (NIH): 109 mg/dL — ABNORMAL HIGH (ref 0–99)
Triglycerides: 145 mg/dL (ref 0–149)
VLDL Cholesterol Cal: 26 mg/dL (ref 5–40)

## 2024-01-17 LAB — HEMOGLOBIN A1C
Est. average glucose Bld gHb Est-mCnc: 117 mg/dL
Hgb A1c MFr Bld: 5.7 % — ABNORMAL HIGH (ref 4.8–5.6)

## 2024-01-20 DIAGNOSIS — F329 Major depressive disorder, single episode, unspecified: Secondary | ICD-10-CM | POA: Diagnosis not present

## 2024-01-31 DIAGNOSIS — Z713 Dietary counseling and surveillance: Secondary | ICD-10-CM | POA: Diagnosis not present

## 2024-02-05 DIAGNOSIS — F329 Major depressive disorder, single episode, unspecified: Secondary | ICD-10-CM | POA: Diagnosis not present

## 2024-02-06 DIAGNOSIS — E291 Testicular hypofunction: Secondary | ICD-10-CM | POA: Diagnosis not present

## 2024-02-06 DIAGNOSIS — F40231 Fear of injections and transfusions: Secondary | ICD-10-CM | POA: Diagnosis not present

## 2024-02-06 DIAGNOSIS — E280 Estrogen excess: Secondary | ICD-10-CM | POA: Diagnosis not present

## 2024-02-14 DIAGNOSIS — Z713 Dietary counseling and surveillance: Secondary | ICD-10-CM | POA: Diagnosis not present

## 2024-02-17 DIAGNOSIS — F329 Major depressive disorder, single episode, unspecified: Secondary | ICD-10-CM | POA: Diagnosis not present

## 2024-02-20 ENCOUNTER — Other Ambulatory Visit: Payer: Self-pay | Admitting: Family

## 2024-02-20 DIAGNOSIS — E785 Hyperlipidemia, unspecified: Secondary | ICD-10-CM

## 2024-02-26 ENCOUNTER — Ambulatory Visit (INDEPENDENT_AMBULATORY_CARE_PROVIDER_SITE_OTHER): Admitting: Family

## 2024-02-26 ENCOUNTER — Encounter: Payer: Self-pay | Admitting: Family

## 2024-02-26 VITALS — BP 116/82 | HR 80 | Temp 98.3°F | Resp 16 | Ht 76.0 in | Wt 366.0 lb

## 2024-02-26 DIAGNOSIS — E785 Hyperlipidemia, unspecified: Secondary | ICD-10-CM | POA: Diagnosis not present

## 2024-02-26 DIAGNOSIS — L819 Disorder of pigmentation, unspecified: Secondary | ICD-10-CM | POA: Diagnosis not present

## 2024-02-26 DIAGNOSIS — F411 Generalized anxiety disorder: Secondary | ICD-10-CM

## 2024-02-26 MED ORDER — FLUOXETINE HCL 20 MG PO CAPS: 20.0000 mg | ORAL_CAPSULE | Freq: Every day | ORAL | 0 refills | Status: DC

## 2024-02-26 MED ORDER — NYSTATIN 100000 UNIT/GM EX CREA: 1.0000 | TOPICAL_CREAM | Freq: Two times a day (BID) | CUTANEOUS | 2 refills | Status: AC

## 2024-02-26 NOTE — Progress Notes (Signed)
 Patient ID: Jonathan Kaufman, male    DOB: Mar 05, 1996  MRN: [id number]  CC: Chronic Conditions Follow-Up  Subjective: Jonathan Kaufman is a 28 y.o. male who presents for chronic conditions follow-up.   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 States flight anxiety and leaving from home anxiety has improved. States his therapist recommended Fluoxetine  dose to be increased. He denies thoughts of self-harm, suicidal ideations, homicidal ideations. - Doing well on Atorvastatin , no issues/concerns.  - States bilateral underarm discoloration (right greater than left) persisting. Denies red flag symptoms. States thinks related to fungal infection. He tried over-the-counter supplements with minimal improvement.  Patient Active Problem List   Diagnosis Date Noted   Prediabetes 05/25/2022   EBV infection 02/16/2022   Sore throat 02/16/2022   NAFLD (nonalcoholic fatty liver disease) 89/96/7977   Low serum testosterone  level 10/18/2019   Elevated ALT measurement 11/28/2018   Mixed hyperlipidemia 11/26/2018   Family history of thyroid  disease 05/30/2018   Seasonal allergies 01/20/2015     Current Outpatient Medications on File Prior to Visit  Medication Sig Dispense Refill   atorvastatin  (LIPITOR) 20 MG tablet TAKE 1 TABLET BY MOUTH EVERY DAY 90 tablet 0   DESCOVY 200-25 MG tablet Take 1 tablet by mouth daily.     Loratadine (CLARITIN) 10 MG CAPS Take by mouth.     Omega-3 Fatty Acids (FISH OIL) 1200 MG CAPS Take 1,200 mg by mouth 2 (two) times daily.     atorvastatin  (LIPITOR) 20 MG tablet TAKE 1 TABLET BY MOUTH EVERY DAY 90 tablet 0   Coenzyme Q10 (COQ-10) 30 MG CAPS Take by mouth.     dimenhyDRINATE  (DRAMAMINE) 50 MG tablet Take 0.5 tablets (25 mg total) by mouth every 8 (eight) hours as needed. 30 tablet 1   No current facility-administered medications on file prior to visit.    Allergies  Allergen Reactions   Apple Rash    Itchy throat    Proanthocyanidin Itching   Banana     Itchy throat   Other     Cats and bunnies    Social History   Socioeconomic History   Marital status: Married    Spouse name: Not on file   Number of children: Not on file   Years of education: Not on file   Highest education level: Bachelor's degree (e.g., BA, AB, BS)  Occupational History   Not on file  Tobacco Use   Smoking status: Never    Passive exposure: Never   Smokeless tobacco: Never  Vaping Use   Vaping status: Never Used  Substance and Sexual Activity   Alcohol use: Not Currently   Drug use: No   Sexual activity: Yes    Partners: Male    Birth control/protection: None  Other Topics Concern   Not on file  Social History Narrative   Not on file   Social Drivers of Health   Financial Resource Strain: Low Risk  (02/19/2024)   Overall Financial Resource Strain (CARDIA)    Difficulty of Paying Living Expenses: Not hard at all  Food Insecurity: No Food Insecurity (02/19/2024)   Hunger Vital Sign    Worried About Running Out of Food in the Last Year: Never true    Ran Out of Food in the Last Year: Never true  Transportation Needs: No Transportation Needs (02/19/2024)   PRAPARE - Administrator, Civil Service (Medical): No    Lack of Transportation (Non-Medical): No  Physical Activity: Insufficiently Active (02/19/2024)   Exercise Vital Sign    Days of Exercise per Week: 2 days    Minutes of Exercise per Session: 60 min  Stress: Stress Concern Present (02/19/2024)   [employer] of Occupational Health - Occupational Stress Questionnaire    Feeling of Stress: To some extent  Social Connections: Moderately Integrated (02/19/2024)   Social Connection and Isolation Panel    Frequency of Communication with Friends and Family: Three times a week    Frequency of Social Gatherings with Friends and Family: Three times a week    Attends Religious Services: Never    Active Member of Clubs or Organizations: Yes     Attends Engineer, structural: More than 4 times per year    Marital Status: Married  Catering manager Violence: Not on file    Family History  Problem Relation Age of Onset   Depression Mother    Bipolar disorder Father    Schizophrenia Father    Thyroid  disease Paternal Grandmother    Colon cancer Neg Hx    Esophageal cancer Neg Hx     Past Surgical History:  Procedure Laterality Date   MOUTH SURGERY      ROS: Review of Systems Negative except as stated above  PHYSICAL EXAM: BP 116/82   Pulse 80   Temp 98.3 F (36.8 C) (Oral)   Resp 16   Ht 6' 4 (1.93 m)   Wt (!) 366 lb (166 kg)   SpO2 95%   BMI 44.55 kg/m   Physical Exam HENT:     Head: Normocephalic and atraumatic.     Nose: Nose normal.     Mouth/Throat:     Mouth: Mucous membranes are moist.     Pharynx: Oropharynx is clear.  Eyes:     Extraocular Movements: Extraocular movements intact.     Conjunctiva/sclera: Conjunctivae normal.     Pupils: Pupils are equal, round, and reactive to light.  Cardiovascular:     Rate and Rhythm: Normal rate and regular rhythm.     Pulses: Normal pulses.     Heart sounds: Normal heart sounds.  Pulmonary:     Effort: Pulmonary effort is normal.     Breath sounds: Normal breath sounds.  Musculoskeletal:        General: Normal range of motion.     Cervical back: Normal range of motion and neck supple.  Skin:    General: Skin is warm and dry.     Comments: Hyperpigmentation bilateral underarms, no drainage.  Neurological:     General: No focal deficit present.     Mental Status: He is alert and oriented to person, place, and time.  Psychiatric:        Mood and Affect: Mood normal.        Behavior: Behavior normal.     ASSESSMENT AND PLAN: 1. GAD (generalized anxiety disorder) (Primary) - Patient denies thoughts of self-harm, suicidal ideations, homicidal ideations. - Increase Fluoxetine  from 10 mg to 20 mg as prescribed. Counseled on medication  adherence/adverse effects.  - Keep all scheduled appointments with therapist. - Follow-up with primary provider in 4 weeks or sooner if needed.  - FLUoxetine  (PROZAC ) 20 MG capsule; Take 1 capsule (20 mg total) by mouth daily.  Dispense: 90 capsule; Refill: 0  2. Hyperlipidemia, unspecified hyperlipidemia type - Continue Atorvastatin  as prescribed. Counseled on medication adherence/adverse effects. No refills needed as of present.  - Follow-up with primary provider as scheduled.  3.  Discoloration of skin - Nystatin  as prescribed. Counseled on medication adherence/adverse effects.  - Referral to Dermatology for evaluation/management.  - Follow-up with primary provider as scheduled. - Ambulatory referral to Dermatology - nystatin  cream (MYCOSTATIN ); Apply 1 Application topically 2 (two) times daily.  Dispense: 60 g; Refill: 2   Patient was given the opportunity to ask questions.  Patient verbalized understanding of the plan and was able to repeat key elements of the plan. Patient was given clear instructions to go to Emergency Department or return to medical center if symptoms don't improve, worsen, or new problems develop.The patient verbalized understanding.   Orders Placed This Encounter  Procedures   Ambulatory referral to Dermatology     Requested Prescriptions   Signed Prescriptions Disp Refills   FLUoxetine  (PROZAC ) 20 MG capsule 90 capsule 0    Sig: Take 1 capsule (20 mg total) by mouth daily.   nystatin  cream (MYCOSTATIN ) 60 g 2    Sig: Apply 1 Application topically 2 (two) times daily.    Return in about 4 weeks (around 03/25/2024) for Follow-Up or next available chronic conditions.  Greig JINNY Drones, NP

## 2024-02-26 NOTE — Progress Notes (Signed)
 3 month follow, increasing Prozac , a fungal infection

## 2024-03-02 DIAGNOSIS — F329 Major depressive disorder, single episode, unspecified: Secondary | ICD-10-CM | POA: Diagnosis not present

## 2024-03-04 DIAGNOSIS — Z713 Dietary counseling and surveillance: Secondary | ICD-10-CM | POA: Diagnosis not present

## 2024-03-08 ENCOUNTER — Encounter: Payer: Self-pay | Admitting: Family

## 2024-03-16 DIAGNOSIS — F329 Major depressive disorder, single episode, unspecified: Secondary | ICD-10-CM | POA: Diagnosis not present

## 2024-03-18 DIAGNOSIS — Z713 Dietary counseling and surveillance: Secondary | ICD-10-CM | POA: Diagnosis not present

## 2024-03-31 DIAGNOSIS — F329 Major depressive disorder, single episode, unspecified: Secondary | ICD-10-CM | POA: Diagnosis not present

## 2024-04-02 ENCOUNTER — Encounter: Payer: Self-pay | Admitting: Family

## 2024-04-02 ENCOUNTER — Ambulatory Visit: Admitting: Family

## 2024-04-02 VITALS — BP 118/81 | HR 79 | Temp 98.3°F | Resp 18 | Ht 76.0 in | Wt 362.4 lb

## 2024-04-02 DIAGNOSIS — Z23 Encounter for immunization: Secondary | ICD-10-CM

## 2024-04-02 DIAGNOSIS — Z79899 Other long term (current) drug therapy: Secondary | ICD-10-CM

## 2024-04-02 DIAGNOSIS — F411 Generalized anxiety disorder: Secondary | ICD-10-CM

## 2024-04-02 MED ORDER — FLUOXETINE HCL 10 MG PO CAPS: 10.0000 mg | ORAL_CAPSULE | Freq: Every day | ORAL | 0 refills | Status: DC

## 2024-04-02 MED ORDER — HYDROXYZINE PAMOATE 25 MG PO CAPS: 25.0000 mg | ORAL_CAPSULE | Freq: Three times a day (TID) | ORAL | 1 refills | Status: AC | PRN

## 2024-04-02 NOTE — Progress Notes (Signed)
 Patient ID: Jonathan Kaufman, male    DOB: 1995/09/20  MRN: [id number]  CC: Chronic Conditions Follow-Up  Subjective: Jonathan Kaufman is a 28 y.o. male who presents for chronic conditions follow-up.   His concerns today include:  States Fluoxetine  20 mg causing upset stomach. States he considered decreasing back to Fluoxetine  10 mg to see if this helps but doesn't want to be on an emotional roller coaster with the decreased dose. He denies thoughts of self-harm, suicidal ideations, homicidal ideations.  Patient Active Problem List   Diagnosis Date Noted   Prediabetes 05/25/2022   EBV infection 02/16/2022   Sore throat 02/16/2022   NAFLD (nonalcoholic fatty liver disease) 89/96/7977   Low serum testosterone  level 10/18/2019   Elevated ALT measurement 11/28/2018   Mixed hyperlipidemia 11/26/2018   Family history of thyroid  disease 05/30/2018   Seasonal allergies 01/20/2015     Current Outpatient Medications on File Prior to Visit  Medication Sig Dispense Refill   atorvastatin  (LIPITOR) 20 MG tablet TAKE 1 TABLET BY MOUTH EVERY DAY 90 tablet 0   DESCOVY 200-25 MG tablet Take 1 tablet by mouth daily.     Loratadine (CLARITIN) 10 MG CAPS Take by mouth.     Omega-3 Fatty Acids (FISH OIL) 1200 MG CAPS Take 1,200 mg by mouth 2 (two) times daily.     atorvastatin  (LIPITOR) 20 MG tablet TAKE 1 TABLET BY MOUTH EVERY DAY 90 tablet 0   Coenzyme Q10 (COQ-10) 30 MG CAPS Take by mouth.     dimenhyDRINATE  (DRAMAMINE) 50 MG tablet Take 0.5 tablets (25 mg total) by mouth every 8 (eight) hours as needed. 30 tablet 1   nystatin  cream (MYCOSTATIN ) Apply 1 Application topically 2 (two) times daily. 60 g 2   No current facility-administered medications on file prior to visit.    Allergies  Allergen Reactions   Apple Rash    Itchy throat   Proanthocyanidin Itching   Banana     Itchy throat   Other     Cats and bunnies    Social History   Socioeconomic History   Marital status: Married     Spouse name: Not on file   Number of children: Not on file   Years of education: Not on file   Highest education level: Bachelor's degree (e.g., BA, AB, BS)  Occupational History   Not on file  Tobacco Use   Smoking status: Never    Passive exposure: Never   Smokeless tobacco: Never  Vaping Use   Vaping status: Never Used  Substance and Sexual Activity   Alcohol use: Not Currently   Drug use: No   Sexual activity: Yes    Partners: Male    Birth control/protection: None  Other Topics Concern   Not on file  Social History Narrative   Not on file   Social Drivers of Health   Financial Resource Strain: Low Risk  (02/19/2024)   Overall Financial Resource Strain (CARDIA)    Difficulty of Paying Living Expenses: Not hard at all  Food Insecurity: No Food Insecurity (02/19/2024)   Hunger Vital Sign    Worried About Running Out of Food in the Last Year: Never true    Ran Out of Food in the Last Year: Never true  Transportation Needs: No Transportation Needs (02/19/2024)   PRAPARE - Administrator, Civil Service (Medical): No    Lack of Transportation (Non-Medical): No  Physical Activity: Insufficiently Active (02/19/2024)   Exercise Vital Sign  Days of Exercise per Week: 2 days    Minutes of Exercise per Session: 60 min  Stress: Stress Concern Present (02/19/2024)   [employer] of Occupational Health - Occupational Stress Questionnaire    Feeling of Stress: To some extent  Social Connections: Moderately Integrated (02/19/2024)   Social Connection and Isolation Panel    Frequency of Communication with Friends and Family: Three times a week    Frequency of Social Gatherings with Friends and Family: Three times a week    Attends Religious Services: Never    Active Member of Clubs or Organizations: Yes    Attends Engineer, structural: More than 4 times per year    Marital Status: Married  Catering manager Violence: Not on file    Family History  Problem  Relation Age of Onset   Depression Mother    Bipolar disorder Father    Schizophrenia Father    Thyroid  disease Paternal Grandmother    Colon cancer Neg Hx    Esophageal cancer Neg Hx     Past Surgical History:  Procedure Laterality Date   MOUTH SURGERY      ROS: Review of Systems Negative except as stated above  PHYSICAL EXAM: BP 118/81   Pulse 79   Temp 98.3 F (36.8 C) (Oral)   Resp 18   Ht 6' 4 (1.93 m)   Wt (!) 362 lb 6.4 oz (164.4 kg)   SpO2 94%   BMI 44.11 kg/m   Physical Exam HENT:     Head: Normocephalic and atraumatic.     Nose: Nose normal.     Mouth/Throat:     Mouth: Mucous membranes are moist.     Pharynx: Oropharynx is clear.  Eyes:     Extraocular Movements: Extraocular movements intact.     Conjunctiva/sclera: Conjunctivae normal.     Pupils: Pupils are equal, round, and reactive to light.  Cardiovascular:     Rate and Rhythm: Normal rate and regular rhythm.     Pulses: Normal pulses.     Heart sounds: Normal heart sounds.  Pulmonary:     Effort: Pulmonary effort is normal.     Breath sounds: Normal breath sounds.  Musculoskeletal:        General: Normal range of motion.     Cervical back: Normal range of motion and neck supple.  Neurological:     General: No focal deficit present.     Mental Status: He is alert and oriented to person, place, and time.  Psychiatric:        Mood and Affect: Mood normal.        Behavior: Behavior normal.     ASSESSMENT AND PLAN: 1. GAD (generalized anxiety disorder) (Primary) - Patient denies thoughts of self-harm, suicidal ideations, homicidal ideations. - Decrease Fluoxetine  from 20 mg to 10 mg as prescribed with goal of decreasing gastrointestinal side effects. Counseled on medication adherence/adverse effects.  - Trial Hydroxyzine  as prescribed. Counseled on medication adherence/adverse effects.  - Keep all scheduled appointments with therapist.  - Follow-up with primary provider in 4 weeks or  sooner if needed. - FLUoxetine  (PROZAC ) 10 MG capsule; Take 1 capsule (10 mg total) by mouth daily.  Dispense: 90 capsule; Refill: 0 - hydrOXYzine  (VISTARIL ) 25 MG capsule; Take 1 capsule (25 mg total) by mouth every 8 (eight) hours as needed.  Dispense: 90 capsule; Refill: 1  2. Immunization due - Administered. - Flu vaccine trivalent PF, 6mos and older(Flulaval,Afluria,Fluarix,Fluzone)   Patient was given the  opportunity to ask questions.  Patient verbalized understanding of the plan and was able to repeat key elements of the plan. Patient was given clear instructions to go to Emergency Department or return to medical center if symptoms don't improve, worsen, or new problems develop.The patient verbalized understanding.   Orders Placed This Encounter  Procedures   Flu vaccine trivalent PF, 6mos and older(Flulaval,Afluria,Fluarix,Fluzone)     Requested Prescriptions   Signed Prescriptions Disp Refills   FLUoxetine  (PROZAC ) 10 MG capsule 90 capsule 0    Sig: Take 1 capsule (10 mg total) by mouth daily.   hydrOXYzine  (VISTARIL ) 25 MG capsule 90 capsule 1    Sig: Take 1 capsule (25 mg total) by mouth every 8 (eight) hours as needed.    Return in about 4 weeks (around 04/30/2024) for Follow-Up or next available chronic conditions.  Greig JINNY Drones, NP

## 2024-04-02 NOTE — Progress Notes (Signed)
 Follow up.

## 2024-04-08 DIAGNOSIS — L700 Acne vulgaris: Secondary | ICD-10-CM | POA: Diagnosis not present

## 2024-04-08 DIAGNOSIS — L83 Acanthosis nigricans: Secondary | ICD-10-CM | POA: Diagnosis not present

## 2024-04-15 DIAGNOSIS — K760 Fatty (change of) liver, not elsewhere classified: Secondary | ICD-10-CM | POA: Diagnosis not present

## 2024-04-15 DIAGNOSIS — F329 Major depressive disorder, single episode, unspecified: Secondary | ICD-10-CM | POA: Diagnosis not present

## 2024-04-15 DIAGNOSIS — R7401 Elevation of levels of liver transaminase levels: Secondary | ICD-10-CM | POA: Diagnosis not present

## 2024-04-15 DIAGNOSIS — Z713 Dietary counseling and surveillance: Secondary | ICD-10-CM | POA: Diagnosis not present

## 2024-04-15 NOTE — Progress Notes (Signed)
 [facility] [place]  Patient: Jonathan Kaufman Age: 28 y.o.        DOB: 08-06-1995  MRN: [id number] Sex: male      DATE: 04/15/2024      Referring Provider/Primary Care Provider:   Greig Drones, NP [address] [place], [place] [address] Office [phone] Office fax [phone]  Subjective   Jonathan Kaufman is a 27 y.o. 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  Jonathan Kaufman was last seen by me at [facility] [place] on 05/03/2023 and is here today for follow up.  Patient with MASLD.  Medical history is also notable for hyperlipidemia, obesity, low testosterone .  Patient also notes that he has an eating disorder for which he is currently in counseling..  Patient with isolated elevated ALT first noted in February 2022 with persistent elevation in May 2022.  ALT is <2 times upper limits of normal.  Serologic evaluation was negative for viral, genetic, and autoimmune hepatitis.  He has evidence of immunity to hepatitis A but no immunity to hepatitis B and was recommended to be vaccinated for this.  Fibrotest estimates F0 fibrosis.  Most recent labs with FIB-4 excluding advanced fibrosis.  Complete abdominal ultrasound in July 2022 reporting hepatic steatosis, and was otherwise unremarkable.  Most recent ultrasound in October 2024 continued to show steatosis.  He is currently on atorvastatin .  He also reports that he is being seen by clinical nutrition to help with weight management.      Patient Active Problem List  Diagnosis  . Depression, recurrent  . Elevated ALT measurement  . Fatigue  . Low serum testosterone  level  . Mixed hyperlipidemia  . Situational anxiety  . Metabolic dysfunction-associated steatotic liver disease (MASLD)    Past Surgical History:  Procedure Laterality Date  . NO PAST SURGERIES      Family History  Problem Relation Name Age of Onset  . Depression Mother     . Bipolar disorder Father    . Schizophrenia Father    . No Known Problems Brother    . No Known Problems Brother    . No Known Problems Brother    . Liver disease Maternal Grandfather    . Thyroid  disease Paternal Grandmother      Allergies  Allergen Reactions  . Apple Other (See Comments)    Itchy throat  . Grape Itching  . Banana     Itchy throat  . Cat Dander   . Pollen Extracts     Tree pollen    Current Outpatient Medications  Medication Instructions  . anastrozole (ARIMIDEX) 1 mg tablet 1 tablet, Weekly  . atorvastatin  (LIPITOR) 40 mg, Daily  . Descovy 200-25 mg tablet 1 tablet, Daily  . docosahexaenoic acid/epa (FISH OIL ORAL) Take by mouth.  . FLUoxetine  (PROZAC ) 10 mg, Daily  . hydrOXYzine  pamoate (VISTARIL ) 25 mg capsule 1 capsule, 3 times daily with meals  . loratadine (CLARITIN) 10 mg, Daily  . testosterone  20.25 mg/1.25 gram (1.62 %) glpm Daily    Objective:    Vitals:   04/15/24 0951  BP: 133/79  BP Location: Right arm  Patient Position: Sitting  Pulse: 89  Temp: 98.2 F (36.8 C)  TempSrc: Skin  SpO2: 97%  Weight: (!) 167 kg (368 lb 6.4 oz)  Height: 1.93 m (6' 4)     Physical Exam Vitals reviewed.  Constitutional:  General: He is not in acute distress.    Appearance: Normal appearance. He is obese.  Eyes:     General: No scleral icterus.    Extraocular Movements: Extraocular movements intact.     Pupils: Pupils are equal, round, and reactive to light.  Cardiovascular:     Rate and Rhythm: Normal rate and regular rhythm.     Heart sounds: No murmur heard. Pulmonary:     Effort: Pulmonary effort is normal. No respiratory distress.     Breath sounds: Normal breath sounds.  Abdominal:     General: There is no distension.     Palpations: Abdomen is soft. There is no shifting dullness, fluid wave, hepatomegaly, splenomegaly or mass.     Tenderness: There is no abdominal tenderness.     Hernia: No hernia is present.  Musculoskeletal:      Right lower leg: No edema.     Left lower leg: No edema.  Lymphadenopathy:     Cervical: No cervical adenopathy.  Skin:    General: Skin is warm and dry.     Coloration: Skin is not jaundiced.  Neurological:     General: No focal deficit present.     Mental Status: He is alert and oriented to person, place, and time.     Motor: No tremor.     Gait: Gait is intact.     Comments: No asterixis  Psychiatric:        Mood and Affect: Mood normal.        Behavior: Behavior normal.        Thought Content: Thought content normal.        Judgment: Judgment normal.      Fibrosis staging: 12/2023 FIBR-4 = 0.43  02/2023: FIB-4 = 0.40   01/18/2021:    Serology:       LABS:   IMAGING: 05/15/2023 ULTRASOUND ABDOMEN LIMITED RIGHT UPPER QUADRANT   COMPARISON:  None Available.   FINDINGS:  Gallbladder:   No gallstones or wall thickening visualized. No sonographic Murphy  sign noted by sonographer.   Common bile duct:   Diameter: 4.7 mL   Liver:   Increased echogenicity. No focal lesion. Portal vein is patent on  color Doppler imaging with normal direction of blood flow towards  the liver.   Other: None.   IMPRESSION:  1. Increased hepatic parenchymal echogenicity suggestive of  steatosis.  2. No cholelithiasis or sonographic evidence for acute  cholecystitis.      Assessment / Plan:   Problem List Items Addressed This Visit     Elevated ALT measurement   Overview  Patient with minimally elevated isolated ALT <2 times upper limits of normal.  Patient has risk factors for fatty liver disease including obesity and hyperlipidemia.  Serologic evaluation for other underlying etiology of liver disease was unremarkable.  It is likely that his elevated ALT is secondary to MASLD.  Serologic evaluation was unremarkable for viral, genetic, or autoimmune liver disease.      Metabolic dysfunction-associated steatotic liver disease (MASLD) - Primary   Overview   Patient with stable MASLD and most recent non-invasive fibrosis staging <F2 fibrosis.   Current consensus from the American Gastroenterological Association and the American Association for the Study of Liver Diseases supports management of such patients in the primary care setting, as there are no FDA-approved pharmacologic agents for MASLD in the absence of at least stage F2 fibrosis.  To support ongoing management of MASLD, I recommend the following general guidelines:  Surveillance and Referral:  - Annual FIB-4 Index: Please calculate the FIB-4 index annually to monitor for progression of fibrosis. If the FIB-4 score rises to >=1.3, or if there are other clinical changes suggestive of fibrosis progression (e.g., new-onset thrombocytopenia, rising aminotransferases, or imaging findings), referral back to hepatology is recommended for further risk stratification, including possible transient elastography or additional noninvasive testing.[1][2][6]  - Additional Referral Triggers: Refer if there is clinical suspicion for advanced liver disease, discordant noninvasive test results, or development of complications such as portal hypertension or hepatocellular carcinoma.  General Management in Primary Care:  - Lifestyle Modification: The cornerstone of MASLD management is comprehensive lifestyle intervention, including:  - Diet: Encourage a Mediterranean-style diet (vegetables, fruits, whole grains, nuts, fish or white meat, olive oil, minimal simple sugars and red/processed meats), which is associated with reduced hepatic steatosis and improved cardiometabolic outcomes.  - Physical Activity: Recommend 150-300 minutes of moderate-intensity or 75-150 minutes of vigorous-intensity exercise per week. Even modest increases in activity can reduce aminotransferases and hepatic steatosis, independent of weight loss.  - Weight Loss: Aim for >=5% weight loss to improve steatosis; >=10% may be needed to  improve steatohepatitis or fibrosis. Structured weight loss programs and, in selected cases, anti-obesity pharmacotherapy or bariatric surgery may be considered.  - Alcohol: Advise restriction or abstinence from alcohol, as even low levels of consumption increase the risk of liver-related events in MASLD.  - Cardiometabolic Risk Reduction:  - Diabetes, Hypertension, Dyslipidemia: Optimize management per current guidelines. Statins are safe and recommended for cardiovascular risk reduction in MASLD, including in compensated cirrhosis.  - Pharmacotherapy: There are no approved medications for MASLD without advanced fibrosis. Agents such as GLP-1 receptor agonists, SGLT2 inhibitors, pioglitazone, and vitamin E may be considered for their approved indications (e.g., diabetes, obesity), but not specifically for MASLD in the absence of F2+ fibrosis.         PLAN: Patient will continue to follow with PCP and work on reducing risk factors associated with steatotic liver disease. Follow-up with hepatology if persistent increased elevation in LFTs or fib 4 > 1.3   Stephane Quest, MSN, NP, A-GPCNP-BC, AF-AASLD Nurse Practitioner  [facility] [place] Office: [phone]  Fax: [phone] [facility]  Mailing: [address], [place], [place] [address]

## 2024-04-22 ENCOUNTER — Encounter: Payer: Self-pay | Admitting: Family

## 2024-04-27 NOTE — Telephone Encounter (Signed)
 Patient called to inform  we no longer due outside labs for other providers. Patient will need to go to [facility] or some other place. LVM

## 2024-04-29 ENCOUNTER — Telehealth: Payer: Self-pay | Admitting: *Deleted

## 2024-04-29 DIAGNOSIS — F329 Major depressive disorder, single episode, unspecified: Secondary | ICD-10-CM | POA: Diagnosis not present

## 2024-04-29 DIAGNOSIS — Z713 Dietary counseling and surveillance: Secondary | ICD-10-CM | POA: Diagnosis not present

## 2024-04-29 NOTE — Telephone Encounter (Signed)
 Dietician is looking to get labs done on a pt to test for diabetes. Dietician cannot write the orderso she is looking to see if pcp can write order. Pt told dietician that pcp denied writing the order and she need to clarify.

## 2024-05-06 NOTE — Telephone Encounter (Signed)
 Dietician called following up on request, advised of message below

## 2024-05-06 NOTE — Telephone Encounter (Signed)
 I have attempted without success to contact this patient by phone to I left a message on answering machine.

## 2024-05-13 ENCOUNTER — Ambulatory Visit (INDEPENDENT_AMBULATORY_CARE_PROVIDER_SITE_OTHER): Admitting: Family

## 2024-05-13 ENCOUNTER — Encounter: Payer: Self-pay | Admitting: Family

## 2024-05-13 VITALS — BP 114/79 | HR 87 | Temp 98.5°F | Resp 18 | Ht 76.0 in | Wt 373.4 lb

## 2024-05-13 DIAGNOSIS — Z1321 Encounter for screening for nutritional disorder: Secondary | ICD-10-CM | POA: Diagnosis not present

## 2024-05-13 DIAGNOSIS — Z131 Encounter for screening for diabetes mellitus: Secondary | ICD-10-CM

## 2024-05-13 DIAGNOSIS — F411 Generalized anxiety disorder: Secondary | ICD-10-CM

## 2024-05-13 DIAGNOSIS — Z13220 Encounter for screening for lipoid disorders: Secondary | ICD-10-CM | POA: Diagnosis not present

## 2024-05-13 DIAGNOSIS — Z13228 Encounter for screening for other metabolic disorders: Secondary | ICD-10-CM | POA: Diagnosis not present

## 2024-05-13 MED ORDER — FLUOXETINE HCL 10 MG PO CAPS: 10.0000 mg | ORAL_CAPSULE | Freq: Every day | ORAL | 0 refills | Status: DC

## 2024-05-13 NOTE — Progress Notes (Signed)
 Patient ID: Jonathan Kaufman, male    DOB: 06/01/1996  MRN: [id number]  CC: Chronic Conditions Follow-Up  Subjective: Mason Dibiasio is a 28 y.o. male who presents for chronic conditions follow-up.   His concerns today include:  - Doing well on Fluoxetine , no issues/concerns. States he never began taking Hydroxyzine  because he hasn't needed it. He denies thoughts of self-harm, suicidal ideations, homicidal ideations. - States his nutritionist needs CMP, hemoglobin A1c, lipid, and vitamin D. States he called our office twice and his nutritionist called our office once and spoke to nurses who stated that our office does not accept lab orders from any providers not at our office.  Patient Active Problem List   Diagnosis Date Noted   Prediabetes 05/25/2022   EBV infection 02/16/2022   Sore throat 02/16/2022   NAFLD (nonalcoholic fatty liver disease) 89/96/7977   Low serum testosterone  level 10/18/2019   Elevated ALT measurement 11/28/2018   Mixed hyperlipidemia 11/26/2018   Family history of thyroid  disease 05/30/2018   Seasonal allergies 01/20/2015     Current Outpatient Medications on File Prior to Visit  Medication Sig Dispense Refill   atorvastatin  (LIPITOR) 20 MG tablet TAKE 1 TABLET BY MOUTH EVERY DAY 90 tablet 0   DESCOVY 200-25 MG tablet Take 1 tablet by mouth daily.     hydrOXYzine  (VISTARIL ) 25 MG capsule Take 1 capsule (25 mg total) by mouth every 8 (eight) hours as needed. 90 capsule 1   Loratadine (CLARITIN) 10 MG CAPS Take by mouth.     Omega-3 Fatty Acids (FISH OIL) 1200 MG CAPS Take 1,200 mg by mouth 2 (two) times daily.     atorvastatin  (LIPITOR) 20 MG tablet TAKE 1 TABLET BY MOUTH EVERY DAY 90 tablet 0   Coenzyme Q10 (COQ-10) 30 MG CAPS Take by mouth.     dimenhyDRINATE  (DRAMAMINE) 50 MG tablet Take 0.5 tablets (25 mg total) by mouth every 8 (eight) hours as needed. 30 tablet 1   nystatin  cream (MYCOSTATIN ) Apply 1 Application topically 2 (two) times daily. 60 g 2   No  current facility-administered medications on file prior to visit.    Allergies  Allergen Reactions   Apple Rash    Itchy throat   Proanthocyanidin Itching   Banana     Itchy throat   Other     Cats and bunnies    Social History   Socioeconomic History   Marital status: Married    Spouse name: Not on file   Number of children: Not on file   Years of education: Not on file   Highest education level: Bachelor's degree (e.g., BA, AB, BS)  Occupational History   Not on file  Tobacco Use   Smoking status: Never    Passive exposure: Never   Smokeless tobacco: Never  Vaping Use   Vaping status: Never Used  Substance and Sexual Activity   Alcohol use: Not Currently   Drug use: No   Sexual activity: Yes    Partners: Male    Birth control/protection: None  Other Topics Concern   Not on file  Social History Narrative   Not on file   Social Drivers of Health   Financial Resource Strain: Low Risk  (05/12/2024)   Overall Financial Resource Strain (CARDIA)    Difficulty of Paying Living Expenses: Not hard at all  Food Insecurity: No Food Insecurity (05/12/2024)   Hunger Vital Sign    Worried About Running Out of Food in the Last Year: Never  true    Ran Out of Food in the Last Year: Never true  Transportation Needs: No Transportation Needs (05/12/2024)   PRAPARE - Administrator, Civil Service (Medical): No    Lack of Transportation (Non-Medical): No  Physical Activity: Sufficiently Active (05/12/2024)   Exercise Vital Sign    Days of Exercise per Week: 3 days    Minutes of Exercise per Session: 70 min  Recent Concern: Physical Activity - Insufficiently Active (02/19/2024)   Exercise Vital Sign    Days of Exercise per Week: 2 days    Minutes of Exercise per Session: 60 min  Stress: No Stress Concern Present (05/12/2024)   [employer] of Occupational Health - Occupational Stress Questionnaire    Feeling of Stress: Only a little  Recent Concern: Stress -  Stress Concern Present (02/19/2024)   [employer] of Occupational Health - Occupational Stress Questionnaire    Feeling of Stress: To some extent  Social Connections: Moderately Integrated (05/12/2024)   Social Connection and Isolation Panel    Frequency of Communication with Friends and Family: More than three times a week    Frequency of Social Gatherings with Friends and Family: Three times a week    Attends Religious Services: Never    Active Member of Clubs or Organizations: Yes    Attends Engineer, structural: More than 4 times per year    Marital Status: Married  Catering manager Violence: Not on file    Family History  Problem Relation Age of Onset   Depression Mother    Bipolar disorder Father    Schizophrenia Father    Thyroid  disease Paternal Grandmother    Colon cancer Neg Hx    Esophageal cancer Neg Hx     Past Surgical History:  Procedure Laterality Date   MOUTH SURGERY      ROS: Review of Systems Negative except as stated above  PHYSICAL EXAM: BP 114/79   Pulse 87   Temp 98.5 F (36.9 C) (Oral)   Resp 18   Ht 6' 4 (1.93 m)   Wt (!) 373 lb 6.4 oz (169.4 kg)   SpO2 94%   BMI 45.45 kg/m   Physical Exam HENT:     Head: Normocephalic and atraumatic.     Nose: Nose normal.     Mouth/Throat:     Mouth: Mucous membranes are moist.     Pharynx: Oropharynx is clear.  Eyes:     Extraocular Movements: Extraocular movements intact.     Conjunctiva/sclera: Conjunctivae normal.     Pupils: Pupils are equal, round, and reactive to light.  Cardiovascular:     Rate and Rhythm: Normal rate and regular rhythm.     Pulses: Normal pulses.     Heart sounds: Normal heart sounds.  Pulmonary:     Effort: Pulmonary effort is normal.     Breath sounds: Normal breath sounds.  Musculoskeletal:        General: Normal range of motion.     Cervical back: Normal range of motion and neck supple.  Neurological:     General: No focal deficit present.      Mental Status: He is alert and oriented to person, place, and time.  Psychiatric:        Mood and Affect: Mood normal.        Behavior: Behavior normal.    ASSESSMENT AND PLAN: 1. GAD (generalized anxiety disorder) (Primary) - Patient denies thoughts of self-harm, suicidal ideations, homicidal ideations. -  Continue Fluoxetine  as prescribed. Counseled on medication adherence/adverse effects.  - Follow-up with primary provider in 3 months or sooner if needed. - FLUoxetine  (PROZAC ) 10 MG capsule; Take 1 capsule (10 mg total) by mouth daily.  Dispense: 90 capsule; Refill: 0  2. Screening for metabolic disorder - Routine screening.  - CMP14+EGFR  3. Diabetes mellitus screening - Routine screening.  - Hemoglobin A1c  4. Screening cholesterol level - Routine screening.  - Lipid panel  5. Encounter for vitamin deficiency screening - Routine screening.  - Vitamin D, 25-hydroxy  Patient was given the opportunity to ask questions.  Patient verbalized understanding of the plan and was able to repeat key elements of the plan. Patient was given clear instructions to go to Emergency Department or return to medical center if symptoms don't improve, worsen, or new problems develop.The patient verbalized understanding.   Orders Placed This Encounter  Procedures   CMP14+EGFR   Hemoglobin A1c   Lipid panel   Vitamin D, 25-hydroxy     Requested Prescriptions   Signed Prescriptions Disp Refills   FLUoxetine  (PROZAC ) 10 MG capsule 90 capsule 0    Sig: Take 1 capsule (10 mg total) by mouth daily.    Return in about 3 months (around 08/13/2024) for Follow-Up or next available chronic conditions.  Greig JINNY Drones, NP

## 2024-05-13 NOTE — Progress Notes (Signed)
 One month follow up

## 2024-05-14 ENCOUNTER — Ambulatory Visit: Payer: Self-pay | Admitting: Family

## 2024-05-14 DIAGNOSIS — E559 Vitamin D deficiency, unspecified: Secondary | ICD-10-CM

## 2024-05-14 LAB — CMP14+EGFR
ALT: 64 IU/L — ABNORMAL HIGH (ref 0–44)
AST: 34 IU/L (ref 0–40)
Albumin: 4.5 g/dL (ref 4.3–5.2)
Alkaline Phosphatase: 85 IU/L (ref 47–123)
BUN/Creatinine Ratio: 13 (ref 9–20)
BUN: 12 mg/dL (ref 6–20)
Bilirubin Total: 0.4 mg/dL (ref 0.0–1.2)
CO2: 23 mmol/L (ref 20–29)
Calcium: 9.5 mg/dL (ref 8.7–10.2)
Chloride: 102 mmol/L (ref 96–106)
Creatinine, Ser: 0.9 mg/dL (ref 0.76–1.27)
Globulin, Total: 2.5 g/dL (ref 1.5–4.5)
Glucose: 85 mg/dL (ref 70–99)
Potassium: 4.5 mmol/L (ref 3.5–5.2)
Sodium: 138 mmol/L (ref 134–144)
Total Protein: 7 g/dL (ref 6.0–8.5)
eGFR: 119 mL/min/1.73 (ref >59)

## 2024-05-14 LAB — HEMOGLOBIN A1C
Est. average glucose Bld gHb Est-mCnc: 120 mg/dL
Hgb A1c MFr Bld: 5.8 % — ABNORMAL HIGH (ref 4.8–5.6)

## 2024-05-14 LAB — LIPID PANEL
Chol/HDL Ratio: 4.9 ratio (ref 0.0–5.0)
Cholesterol, Total: 176 mg/dL (ref 100–199)
HDL: 36 mg/dL — ABNORMAL LOW (ref >39)
LDL Chol Calc (NIH): 104 mg/dL — ABNORMAL HIGH (ref 0–99)
Triglycerides: 205 mg/dL — ABNORMAL HIGH (ref 0–149)
VLDL Cholesterol Cal: 36 mg/dL (ref 5–40)

## 2024-05-14 LAB — VITAMIN D 25 HYDROXY (VIT D DEFICIENCY, FRACTURES): Vit D, 25-Hydroxy: 18.8 ng/mL — ABNORMAL LOW (ref 30.0–100.0)

## 2024-05-14 MED ORDER — VITAMIN D (ERGOCALCIFEROL) 1.25 MG (50000 UNIT) PO CAPS: 50000.0000 [IU] | ORAL_CAPSULE | ORAL | 0 refills | Status: AC

## 2024-05-21 DIAGNOSIS — F411 Generalized anxiety disorder: Secondary | ICD-10-CM | POA: Diagnosis not present

## 2024-05-21 DIAGNOSIS — Z713 Dietary counseling and surveillance: Secondary | ICD-10-CM | POA: Diagnosis not present

## 2024-05-23 ENCOUNTER — Other Ambulatory Visit: Payer: Self-pay | Admitting: Family

## 2024-05-23 DIAGNOSIS — E785 Hyperlipidemia, unspecified: Secondary | ICD-10-CM

## 2024-05-25 NOTE — Telephone Encounter (Signed)
 Per lab note- continue current treatment Requested Prescriptions  Pending Prescriptions Disp Refills   atorvastatin  (LIPITOR) 20 MG tablet [Pharmacy Med Name: ATORVASTATIN  20 MG TABLET] 90 tablet 0    Sig: TAKE 1 TABLET BY MOUTH EVERY DAY     Cardiovascular:  Antilipid - Statins Failed - 05/25/2024  3:37 PM      Failed - Lipid Panel in normal range within the last 12 months    Cholesterol, Total  Date Value Ref Range Status  05/13/2024 176 100 - 199 mg/dL Final   LDL Chol Calc (NIH)  Date Value Ref Range Status  05/13/2024 104 (H) 0 - 99 mg/dL Final   HDL  Date Value Ref Range Status  05/13/2024 36 (L) >39 mg/dL Final   Triglycerides  Date Value Ref Range Status  05/13/2024 205 (H) 0 - 149 mg/dL Final         Passed - Patient is not pregnant      Passed - Valid encounter within last 12 months    Recent Outpatient Visits           1 week ago GAD (generalized anxiety disorder)   [facility], Amy J, NP   1 month ago GAD (generalized anxiety disorder)   [facility], Amy J, NP   2 months ago GAD (generalized anxiety disorder)   [facility], Amy J, NP   6 months ago GAD (generalized anxiety disorder)   [place] [facility] [place], NP   1 year ago Anxiety about health   [facility], Greig PARAS, NP

## 2024-06-04 DIAGNOSIS — F411 Generalized anxiety disorder: Secondary | ICD-10-CM | POA: Diagnosis not present

## 2024-06-04 DIAGNOSIS — Z713 Dietary counseling and surveillance: Secondary | ICD-10-CM | POA: Diagnosis not present

## 2024-06-17 DIAGNOSIS — F411 Generalized anxiety disorder: Secondary | ICD-10-CM | POA: Diagnosis not present

## 2024-06-19 DIAGNOSIS — Z713 Dietary counseling and surveillance: Secondary | ICD-10-CM | POA: Diagnosis not present

## 2024-07-08 DIAGNOSIS — F411 Generalized anxiety disorder: Secondary | ICD-10-CM | POA: Diagnosis not present

## 2024-07-10 DIAGNOSIS — Z713 Dietary counseling and surveillance: Secondary | ICD-10-CM | POA: Diagnosis not present

## 2024-08-14 ENCOUNTER — Encounter: Payer: Self-pay | Admitting: Family

## 2024-08-14 NOTE — Telephone Encounter (Signed)
 Schedule appointment?

## 2024-08-19 NOTE — Telephone Encounter (Signed)
 Pt scheduled appt

## 2024-08-20 ENCOUNTER — Other Ambulatory Visit

## 2024-08-21 ENCOUNTER — Encounter: Payer: Self-pay | Admitting: Family

## 2024-08-21 ENCOUNTER — Ambulatory Visit: Admitting: Family

## 2024-08-21 VITALS — BP 106/74 | HR 85 | Ht 76.0 in | Wt 376.8 lb

## 2024-08-21 DIAGNOSIS — Z131 Encounter for screening for diabetes mellitus: Secondary | ICD-10-CM

## 2024-08-21 DIAGNOSIS — Z13220 Encounter for screening for lipoid disorders: Secondary | ICD-10-CM

## 2024-08-21 DIAGNOSIS — Z13228 Encounter for screening for other metabolic disorders: Secondary | ICD-10-CM | POA: Diagnosis not present

## 2024-08-21 DIAGNOSIS — Z1321 Encounter for screening for nutritional disorder: Secondary | ICD-10-CM

## 2024-08-21 DIAGNOSIS — F411 Generalized anxiety disorder: Secondary | ICD-10-CM

## 2024-08-21 MED ORDER — FLUOXETINE HCL 10 MG PO CAPS: 10.0000 mg | ORAL_CAPSULE | Freq: Every day | ORAL | 0 refills | Status: AC

## 2024-08-21 NOTE — Progress Notes (Signed)
 "   Patient ID: Jonathan Kaufman, male    DOB: [birth year]  MRN: [id number]  CC: Follow-Up  Subjective: Jonathan Kaufman is a 29 y.o. male who presents for follow-up.   His concerns today include:  - Doing well on Fluoxetine , no issues/concerns. He denies thoughts of self-harm, suicidal ideations, homicidal ideations. - States he needs the following labs CMP, Vitamin D , lipid panel, hemoglobin A1c, and B12 for his nutritionist.   Patient Active Problem List   Diagnosis Date Noted   Prediabetes 05/25/2022   EBV infection 02/16/2022   Sore throat 02/16/2022   NAFLD (nonalcoholic fatty liver disease) 89/96/7977   Low serum testosterone  level 10/18/2019   Elevated ALT measurement 11/28/2018   Mixed hyperlipidemia 11/26/2018   Family history of thyroid  disease 05/30/2018   Seasonal allergies 01/20/2015     Medications Ordered Prior to Encounter[1]  Allergies[2]  Social History   Socioeconomic History   Marital status: Married    Spouse name: Not on file   Number of children: Not on file   Years of education: Not on file   Highest education level: Bachelor's degree (e.g., BA, AB, BS)  Occupational History   Not on file  Tobacco Use   Smoking status: Never    Passive exposure: Never   Smokeless tobacco: Never  Vaping Use   Vaping status: Never Used  Substance and Sexual Activity   Alcohol use: Not Currently   Drug use: No   Sexual activity: Yes    Partners: Male    Birth control/protection: None  Other Topics Concern   Not on file  Social History Narrative   Not on file   Social Drivers of Health   Tobacco Use: Low Risk (08/21/2024)   Patient History    Smoking Tobacco Use: Never    Smokeless Tobacco Use: Never    Passive Exposure: Never  Financial Resource Strain: Low Risk (05/12/2024)   Overall Financial Resource Strain (CARDIA)    Difficulty of Paying Living Expenses: Not hard at all  Food Insecurity: No Food Insecurity (05/12/2024)   Epic    Worried About Chiropractor of Food in the Last Year: Never true    Ran Out of Food in the Last Year: Never true  Transportation Needs: No Transportation Needs (05/12/2024)   Epic    Lack of Transportation (Medical): No    Lack of Transportation (Non-Medical): No  Physical Activity: Sufficiently Active (05/12/2024)   Exercise Vital Sign    Days of Exercise per Week: 3 days    Minutes of Exercise per Session: 70 min  Recent Concern: Physical Activity - Insufficiently Active (02/19/2024)   Exercise Vital Sign    Days of Exercise per Week: 2 days    Minutes of Exercise per Session: 60 min  Stress: No Stress Concern Present (05/12/2024)   [employer] of Occupational Health - Occupational Stress Questionnaire    Feeling of Stress: Only a little  Recent Concern: Stress - Stress Concern Present (02/19/2024)   [employer] of Occupational Health - Occupational Stress Questionnaire    Feeling of Stress: To some extent  Social Connections: Moderately Integrated (05/12/2024)   Social Connection and Isolation Panel    Frequency of Communication with Friends and Family: More than three times a week    Frequency of Social Gatherings with Friends and Family: Three times a week    Attends Religious Services: Never    Active Member of Clubs or Organizations: Yes    Attends Banker  Meetings: More than 4 times per year    Marital Status: Married  Catering Manager Violence: Not on file  Depression (PHQ2-9): Low Risk (08/21/2024)   Depression (PHQ2-9)    PHQ-2 Score: 0  Alcohol Screen: Low Risk (05/12/2024)   Alcohol Screen    Last Alcohol Screening Score (AUDIT): 1  Housing: Low Risk (05/12/2024)   Epic    Unable to Pay for Housing in the Last Year: No    Number of Times Moved in the Last Year: 0    Homeless in the Last Year: No  Utilities: Not on file  Health Literacy: Not on file    Family History  Problem Relation Age of Onset   Depression Mother    Bipolar disorder Father     Schizophrenia Father    Alcohol abuse Father    ADD / ADHD Father    Thyroid  disease Paternal Grandmother    Colon cancer Neg Hx    Esophageal cancer Neg Hx     Past Surgical History:  Procedure Laterality Date   MOUTH SURGERY      ROS: Review of Systems Negative except as stated above  PHYSICAL EXAM: BP 106/74 (BP Location: Left Arm, Patient Position: Sitting, Cuff Size: Large)   Pulse 85   Ht 6' 4 (1.93 m)   Wt (!) 376 lb 12.8 oz (170.9 kg)   SpO2 97%   BMI 45.87 kg/m   Physical Exam HENT:     Head: Normocephalic and atraumatic.     Nose: Nose normal.     Mouth/Throat:     Mouth: Mucous membranes are moist.     Pharynx: Oropharynx is clear.  Eyes:     Extraocular Movements: Extraocular movements intact.     Conjunctiva/sclera: Conjunctivae normal.     Pupils: Pupils are equal, round, and reactive to light.  Cardiovascular:     Rate and Rhythm: Normal rate and regular rhythm.     Pulses: Normal pulses.     Heart sounds: Normal heart sounds.  Pulmonary:     Effort: Pulmonary effort is normal.     Breath sounds: Normal breath sounds.  Musculoskeletal:        General: Normal range of motion.     Cervical back: Normal range of motion and neck supple.  Neurological:     General: No focal deficit present.     Mental Status: He is alert and oriented to person, place, and time.  Psychiatric:        Mood and Affect: Mood normal.        Behavior: Behavior normal.     ASSESSMENT AND PLAN: 1. GAD (generalized anxiety disorder) (Primary) - Patient denies thoughts of self-harm, suicidal ideations, homicidal ideations. - Continue Fluoxetine  as prescribed. Counseled on medication adherence/adverse effects.  - Follow-up with primary provider in 3 months or sooner if needed.  - FLUoxetine  (PROZAC ) 10 MG capsule; Take 1 capsule (10 mg total) by mouth daily.  Dispense: 90 capsule; Refill: 0  2. Screening for metabolic disorder - Routine screening.  - CMP14+EGFR  3.  Diabetes mellitus screening - Routine screening.  - Hemoglobin A1c  4. Screening cholesterol level - Routine screening.  - Lipid panel; Future  5. Encounter for vitamin deficiency screening - Routine screening.  - Vitamin D , 25-hydroxy - Vitamin B12   Patient was given the opportunity to ask questions.  Patient verbalized understanding of the plan and was able to repeat key elements of the plan. Patient was given clear instructions  to go to Emergency Department or return to medical center if symptoms don't improve, worsen, or new problems develop.The patient verbalized understanding.   Orders Placed This Encounter  Procedures   CMP14+EGFR   Vitamin D , 25-hydroxy   Lipid panel   Hemoglobin A1c   Vitamin B12     Requested Prescriptions   Signed Prescriptions Disp Refills   FLUoxetine  (PROZAC ) 10 MG capsule 90 capsule 0    Sig: Take 1 capsule (10 mg total) by mouth daily.    Return for Follow-up as needed.  Greig JINNY Chute, NP      [1]  Current Outpatient Medications on File Prior to Visit  Medication Sig Dispense Refill   amoxicillin  (AMOXIL ) 500 MG capsule Take 500 mg by mouth 2 (two) times daily.     anastrozole (ARIMIDEX) 1 MG tablet Take 1 mg by mouth once a week.     atorvastatin  (LIPITOR) 20 MG tablet TAKE 1 TABLET BY MOUTH EVERY DAY 90 tablet 3   DESCOVY 200-25 MG tablet Take 1 tablet by mouth daily.     hydrOXYzine  (VISTARIL ) 25 MG capsule Take 1 capsule (25 mg total) by mouth every 8 (eight) hours as needed. 90 capsule 1   Loratadine (CLARITIN) 10 MG CAPS Take by mouth. (Patient taking differently: Take by mouth as needed.)     Omega-3 Fatty Acids (FISH OIL) 1200 MG CAPS Take 1,200 mg by mouth 2 (two) times daily.     Testosterone  20.25 MG/ACT (1.62%) GEL SMARTSIG:3 pump Topical Every Morning     atorvastatin  (LIPITOR) 20 MG tablet TAKE 1 TABLET BY MOUTH EVERY DAY 90 tablet 0   Coenzyme Q10 (COQ-10) 30 MG CAPS Take by mouth.     dimenhyDRINATE  (DRAMAMINE) 50  MG tablet Take 0.5 tablets (25 mg total) by mouth every 8 (eight) hours as needed. 30 tablet 1   nystatin  cream (MYCOSTATIN ) Apply 1 Application topically 2 (two) times daily. 60 g 2   No current facility-administered medications on file prior to visit.  [2]  Allergies Allergen Reactions   Apple Rash    Itchy throat   Proanthocyanidin Itching   Banana     Itchy throat   Cat Dander    Other     Cats and bunnies   "

## 2024-08-24 LAB — CMP14+EGFR
ALT: 62 [IU]/L — ABNORMAL HIGH (ref 0–44)
AST: 37 [IU]/L (ref 0–40)
Albumin: 4.8 g/dL (ref 4.3–5.2)
Alkaline Phosphatase: 84 [IU]/L (ref 47–123)
BUN/Creatinine Ratio: 10 (ref 9–20)
BUN: 10 mg/dL (ref 6–20)
Bilirubin Total: 0.3 mg/dL (ref 0.0–1.2)
CO2: 17 mmol/L — ABNORMAL LOW (ref 20–29)
Calcium: 9.2 mg/dL (ref 8.7–10.2)
Chloride: 104 mmol/L (ref 96–106)
Creatinine, Ser: 1.01 mg/dL (ref 0.76–1.27)
Globulin, Total: 2.7 g/dL (ref 1.5–4.5)
Glucose: 87 mg/dL (ref 70–99)
Potassium: 4.6 mmol/L (ref 3.5–5.2)
Sodium: 141 mmol/L (ref 134–144)
Total Protein: 7.5 g/dL (ref 6.0–8.5)
eGFR: 104 mL/min/{1.73_m2}

## 2024-08-24 LAB — VITAMIN D 25 HYDROXY (VIT D DEFICIENCY, FRACTURES): Vit D, 25-Hydroxy: 18.3 ng/mL — ABNORMAL LOW (ref 30.0–100.0)

## 2024-08-24 LAB — VITAMIN B12: Vitamin B-12: 1215 pg/mL (ref 232–1245)

## 2024-08-24 LAB — HEMOGLOBIN A1C
Est. average glucose Bld gHb Est-mCnc: 117 mg/dL
Hgb A1c MFr Bld: 5.7 % — ABNORMAL HIGH (ref 4.8–5.6)

## 2024-08-25 ENCOUNTER — Ambulatory Visit: Payer: Self-pay | Admitting: Family

## 2024-08-25 DIAGNOSIS — E559 Vitamin D deficiency, unspecified: Secondary | ICD-10-CM

## 2024-08-25 MED ORDER — VITAMIN D (ERGOCALCIFEROL) 1.25 MG (50000 UNIT) PO CAPS: 50000.0000 [IU] | ORAL_CAPSULE | ORAL | 0 refills | Status: AC

## 2024-11-27 ENCOUNTER — Ambulatory Visit: Payer: Self-pay | Admitting: Family
